# Patient Record
Sex: Female | Born: 1960 | Race: White | Hispanic: No
Health system: Southern US, Community
[De-identification: ages and names within clinical notes are randomized; demographics above are authoritative.]

## PROBLEM LIST (undated history)

## (undated) DIAGNOSIS — N186 End stage renal disease: Secondary | ICD-10-CM

## (undated) DIAGNOSIS — N289 Disorder of kidney and ureter, unspecified: Secondary | ICD-10-CM

## (undated) DIAGNOSIS — E785 Hyperlipidemia, unspecified: Secondary | ICD-10-CM

## (undated) DIAGNOSIS — I10 Essential (primary) hypertension: Secondary | ICD-10-CM

## (undated) DIAGNOSIS — E119 Type 2 diabetes mellitus without complications: Secondary | ICD-10-CM

---

## 2021-12-05 ENCOUNTER — Emergency Department (HOSPITAL_COMMUNITY): Payer: Medicare Other

## 2021-12-05 ENCOUNTER — Inpatient Hospital Stay (HOSPITAL_COMMUNITY)
Admission: EM | Admit: 2021-12-05 | Discharge: 2021-12-08 | DRG: 391 | Disposition: A | Discharge status: Home / Self Care | Payer: Medicare Other | Attending: Internal Medicine | Admitting: Internal Medicine

## 2021-12-05 ENCOUNTER — Other Ambulatory Visit: Payer: Self-pay

## 2021-12-05 ENCOUNTER — Encounter (HOSPITAL_COMMUNITY): Payer: Self-pay | Admitting: Emergency Medicine

## 2021-12-05 DIAGNOSIS — E039 Hypothyroidism, unspecified: Secondary | ICD-10-CM

## 2021-12-05 DIAGNOSIS — E785 Hyperlipidemia, unspecified: Secondary | ICD-10-CM

## 2021-12-05 DIAGNOSIS — K219 Gastro-esophageal reflux disease without esophagitis: Secondary | ICD-10-CM | POA: Diagnosis not present

## 2021-12-05 DIAGNOSIS — Z6829 Body mass index (BMI) 29.0-29.9, adult: Secondary | ICD-10-CM

## 2021-12-05 DIAGNOSIS — E1122 Type 2 diabetes mellitus with diabetic chronic kidney disease: Secondary | ICD-10-CM | POA: Diagnosis present

## 2021-12-05 DIAGNOSIS — I120 Hypertensive chronic kidney disease with stage 5 chronic kidney disease or end stage renal disease: Secondary | ICD-10-CM | POA: Diagnosis present

## 2021-12-05 DIAGNOSIS — D631 Anemia in chronic kidney disease: Secondary | ICD-10-CM | POA: Diagnosis present

## 2021-12-05 DIAGNOSIS — Z8673 Personal history of transient ischemic attack (TIA), and cerebral infarction without residual deficits: Secondary | ICD-10-CM

## 2021-12-05 DIAGNOSIS — D649 Anemia, unspecified: Secondary | ICD-10-CM

## 2021-12-05 DIAGNOSIS — I3139 Other pericardial effusion (noninflammatory): Secondary | ICD-10-CM | POA: Diagnosis present

## 2021-12-05 DIAGNOSIS — Z20822 Contact with and (suspected) exposure to covid-19: Secondary | ICD-10-CM | POA: Diagnosis present

## 2021-12-05 DIAGNOSIS — I160 Hypertensive urgency: Secondary | ICD-10-CM | POA: Diagnosis present

## 2021-12-05 DIAGNOSIS — F32A Depression, unspecified: Secondary | ICD-10-CM

## 2021-12-05 DIAGNOSIS — Z978 Presence of other specified devices: Secondary | ICD-10-CM

## 2021-12-05 DIAGNOSIS — R079 Chest pain, unspecified: Secondary | ICD-10-CM | POA: Diagnosis not present

## 2021-12-05 DIAGNOSIS — Z888 Allergy status to other drugs, medicaments and biological substances status: Secondary | ICD-10-CM

## 2021-12-05 DIAGNOSIS — I248 Other forms of acute ischemic heart disease: Secondary | ICD-10-CM | POA: Diagnosis present

## 2021-12-05 DIAGNOSIS — I252 Old myocardial infarction: Secondary | ICD-10-CM

## 2021-12-05 DIAGNOSIS — I2510 Atherosclerotic heart disease of native coronary artery without angina pectoris: Secondary | ICD-10-CM | POA: Diagnosis present

## 2021-12-05 DIAGNOSIS — H5461 Unqualified visual loss, right eye, normal vision left eye: Secondary | ICD-10-CM

## 2021-12-05 DIAGNOSIS — E119 Type 2 diabetes mellitus without complications: Secondary | ICD-10-CM

## 2021-12-05 DIAGNOSIS — F0390 Unspecified dementia without behavioral disturbance: Secondary | ICD-10-CM | POA: Diagnosis present

## 2021-12-05 DIAGNOSIS — E669 Obesity, unspecified: Secondary | ICD-10-CM | POA: Diagnosis present

## 2021-12-05 DIAGNOSIS — N186 End stage renal disease: Secondary | ICD-10-CM

## 2021-12-05 DIAGNOSIS — Z993 Dependence on wheelchair: Secondary | ICD-10-CM

## 2021-12-05 DIAGNOSIS — E1169 Type 2 diabetes mellitus with other specified complication: Secondary | ICD-10-CM

## 2021-12-05 DIAGNOSIS — Z992 Dependence on renal dialysis: Secondary | ICD-10-CM

## 2021-12-05 HISTORY — DX: Disorder of kidney and ureter, unspecified: N28.9

## 2021-12-05 HISTORY — DX: End stage renal disease: N18.6

## 2021-12-05 HISTORY — DX: Essential (primary) hypertension: I10

## 2021-12-05 HISTORY — DX: Hyperlipidemia, unspecified: E78.5

## 2021-12-05 HISTORY — DX: Type 2 diabetes mellitus without complications: E11.9

## 2021-12-05 LAB — CBC WITH DIFFERENTIAL/PLATELET
Abs Immature Granulocytes: 0.01 10*3/uL (ref 0.00–0.07)
Basophils Absolute: 0 10*3/uL (ref 0.0–0.1)
Basophils Relative: 1 %
Eosinophils Absolute: 0.2 10*3/uL (ref 0.0–0.5)
Eosinophils Relative: 4 %
HCT: 32.8 % — ABNORMAL LOW (ref 36.0–46.0)
Hemoglobin: 10.4 g/dL — ABNORMAL LOW (ref 12.0–15.0)
Immature Granulocytes: 0 %
Lymphocytes Relative: 19 %
Lymphs Abs: 0.9 10*3/uL (ref 0.7–4.0)
MCH: 31 pg (ref 26.0–34.0)
MCHC: 31.7 g/dL (ref 30.0–36.0)
MCV: 97.9 fL (ref 80.0–100.0)
Monocytes Absolute: 0.5 10*3/uL (ref 0.1–1.0)
Monocytes Relative: 11 %
Neutro Abs: 3.2 10*3/uL (ref 1.7–7.7)
Neutrophils Relative %: 65 %
Platelets: 232 10*3/uL (ref 150–400)
RBC: 3.35 MIL/uL — ABNORMAL LOW (ref 3.87–5.11)
RDW: 14.9 % (ref 11.5–15.5)
WBC: 4.9 10*3/uL (ref 4.0–10.5)
nRBC: 0 % (ref 0.0–0.2)

## 2021-12-05 LAB — MAGNESIUM: Magnesium: 2.2 mg/dL (ref 1.7–2.4)

## 2021-12-05 LAB — BASIC METABOLIC PANEL
Anion gap: 10 (ref 5–15)
BUN: 32 mg/dL — ABNORMAL HIGH (ref 6–20)
CO2: 26 mmol/L (ref 22–32)
Calcium: 9.2 mg/dL (ref 8.9–10.3)
Chloride: 105 mmol/L (ref 98–111)
Creatinine, Ser: 3.91 mg/dL — ABNORMAL HIGH (ref 0.44–1.00)
GFR, Estimated: 13 mL/min — ABNORMAL LOW (ref >60)
Glucose, Bld: 138 mg/dL — ABNORMAL HIGH (ref 70–99)
Potassium: 3.7 mmol/L (ref 3.5–5.1)
Sodium: 141 mmol/L (ref 135–145)

## 2021-12-05 LAB — TROPONIN I (HIGH SENSITIVITY)
Troponin I (High Sensitivity): 33 ng/L — ABNORMAL HIGH (ref <18)
Troponin I (High Sensitivity): 34 ng/L — ABNORMAL HIGH (ref <18)

## 2021-12-05 IMAGING — CR DG CHEST 2V
2 series · 2 of 2 positions shown · non-contrast
Comparison: None.

CLINICAL DATA: Chest pain, missed dialysis.

EXAM:
CHEST - 2 VIEW

[chest lat]
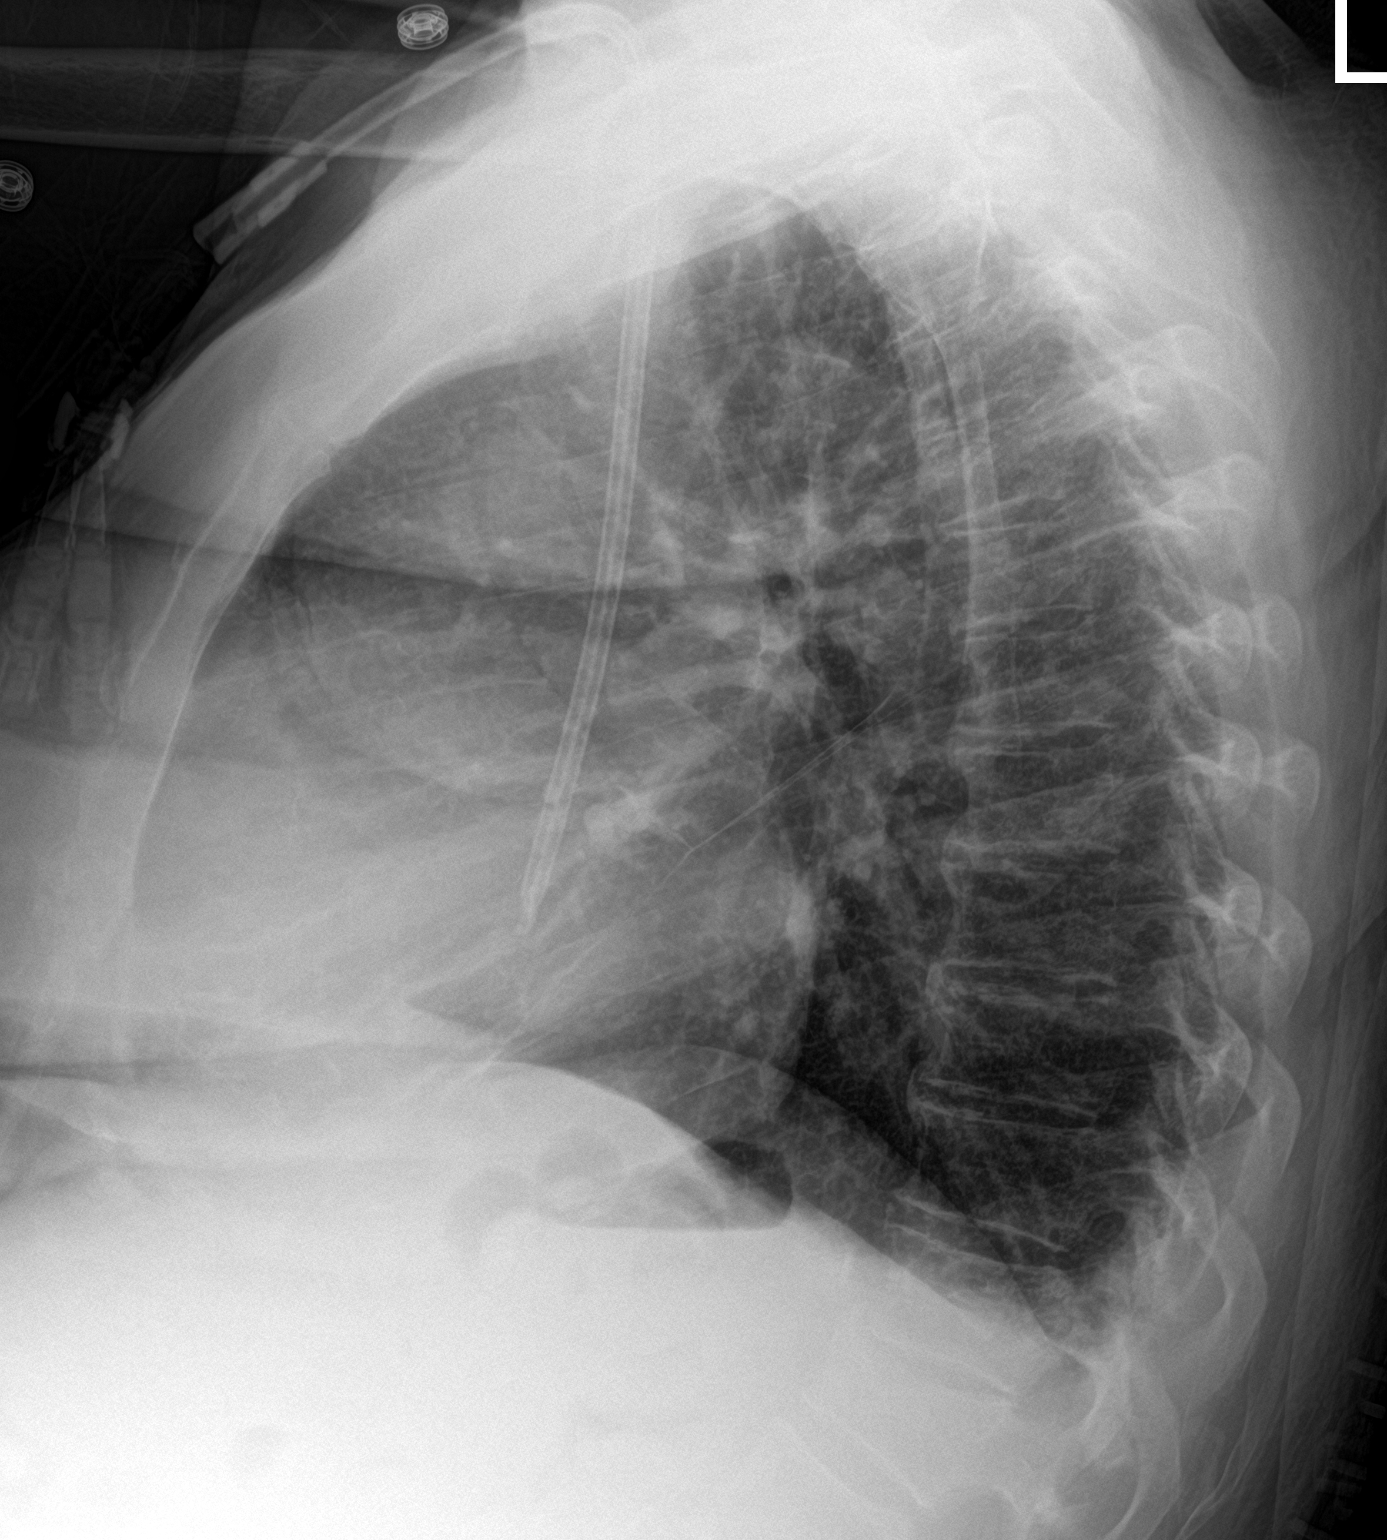

[chest ap]
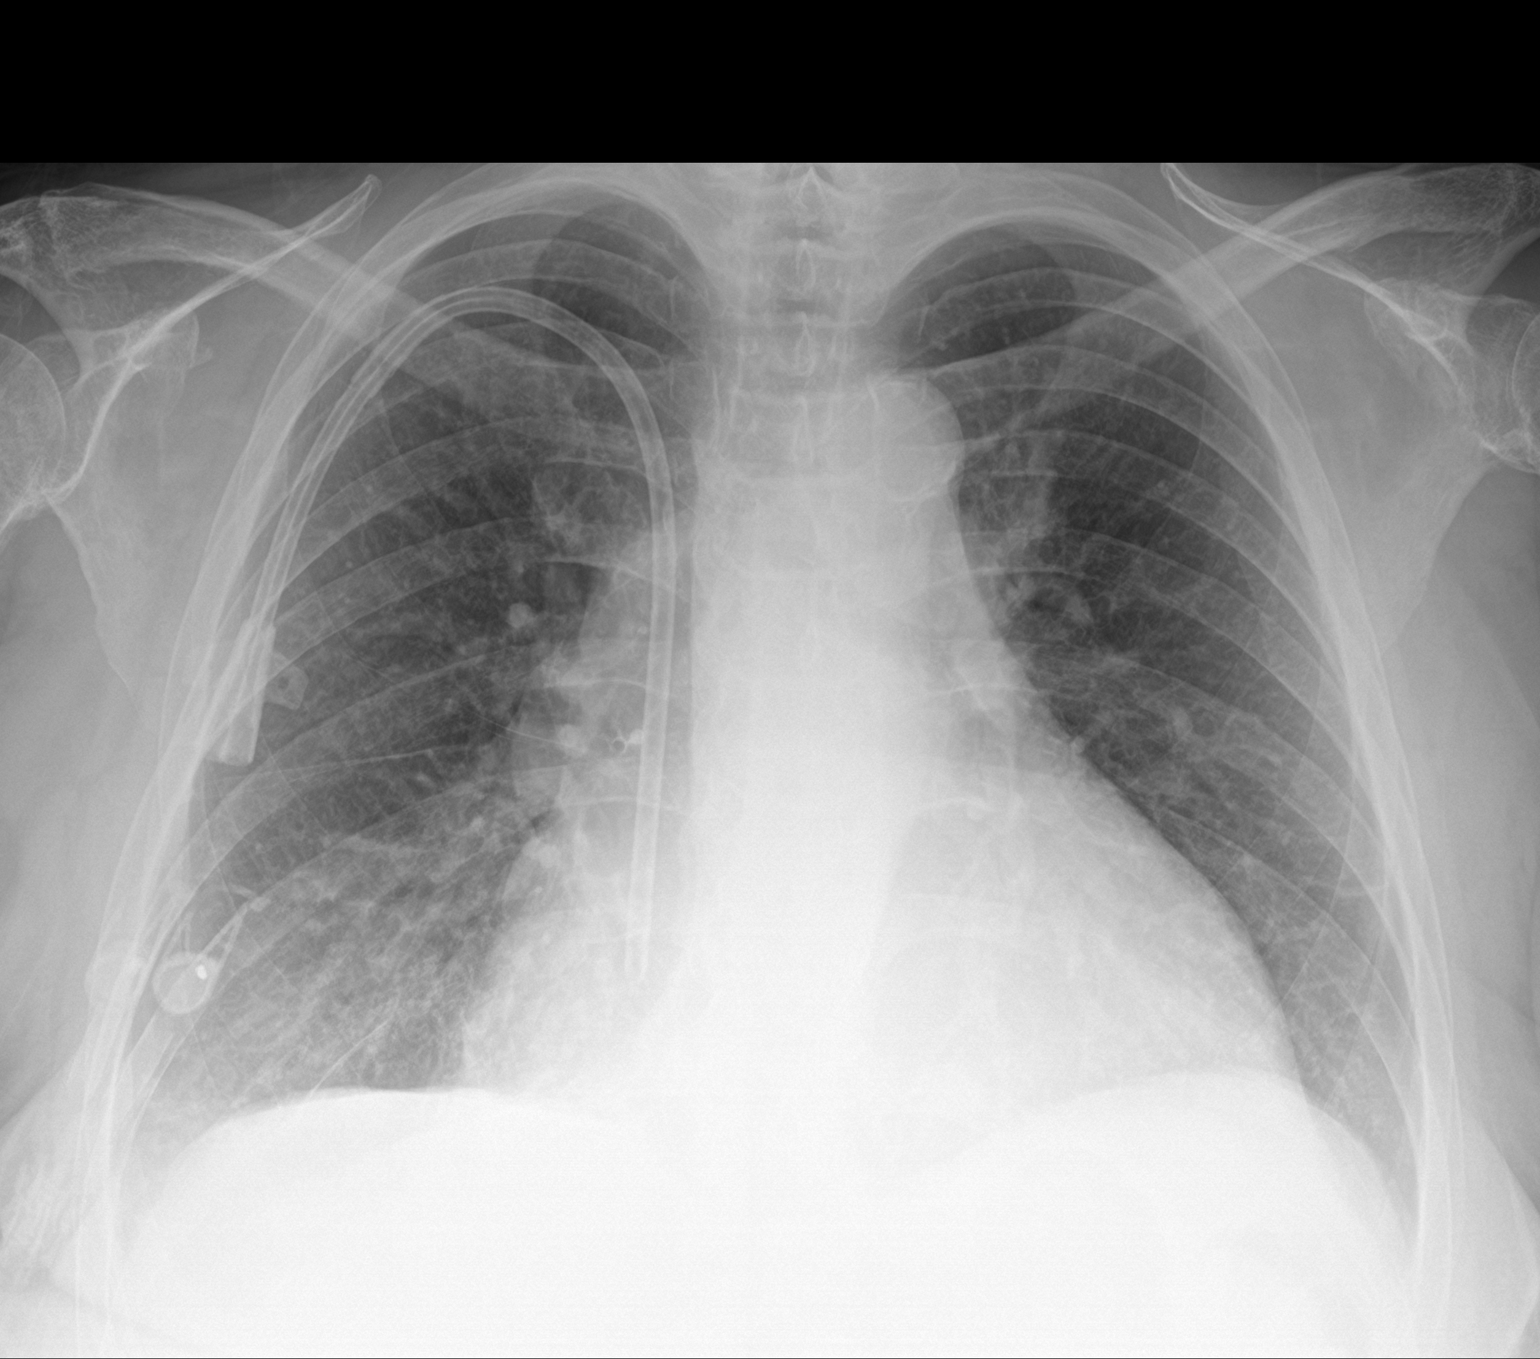

[2 of 2 positions shown; findings below may reference images not displayed]

FINDINGS: Right-sided central venous catheter tip projects over the distal
SVC. The heart is enlarged. There is no pleural effusion or
pneumothorax. No focal lung infiltrate or pulmonary edema. No acute
fractures.
IMPRESSION: 1. Cardiomegaly.
2. No acute cardiopulmonary process.

## 2021-12-05 MED ORDER — ALUM & MAG HYDROXIDE-SIMETH 200-200-20 MG/5ML PO SUSP
30.0000 mL | Freq: Once | ORAL | Status: AC
Start: 2021-12-05 — End: 2021-12-05
  Administered 2021-12-05: 30 mL via ORAL
  Filled 2021-12-05: qty 30

## 2021-12-05 MED ORDER — OXYCODONE-ACETAMINOPHEN 5-325 MG PO TABS
1.0000 | ORAL_TABLET | Freq: Once | ORAL | Status: AC
Start: 2021-12-05 — End: 2021-12-05
  Administered 2021-12-05: 1 via ORAL
  Filled 2021-12-05: qty 1

## 2021-12-05 MED ORDER — LIDOCAINE VISCOUS HCL 2 % MT SOLN
15.0000 mL | Freq: Once | OROMUCOSAL | Status: AC
  Administered 2021-12-05: 15 mL via ORAL
  Filled 2021-12-05: qty 15

## 2021-12-05 NOTE — H&P (Signed)
History and Physical    Patient: Susan Owen QBH:[id number] DOB: October 25, 1961 DOA: 12/05/2021 DOS: the patient was seen and examined on 12/06/2021 PCP: Pcp, No  Patient coming from: Home  Chief Complaint:  Chief Complaint  Patient presents with   missed dialysis   Chest Pain    HPI: [name] is a 61 y.o. female with medical history significant of ESRD on HD TTS, chronic indwelling Foley, hypertension, hyperlipidemia, hypothyroidism, CAD, pericardial effusion status post pericardiocentesis 09/03/2021, depression, cerebral amyloid angiopathy, type 2 diabetes, CHF, CVA, cognitive impairment, wheelchair-bound presented to the ED for evaluation of chest pain.  She missed dialysis yesterday.  In the ED, blood pressure elevated with systolic up to 097D.  Remainder of vital signs stable.  Labs showing WBC 4.9, hemoglobin 10.4, MCV 97.9, platelet count 232k.  Sodium 141, potassium 3.7, chloride 105, bicarb 26, BUN 32, creatinine 3.9, glucose 138.  COVID and influenza PCR pending.  Chest x-ray showing cardiomegaly and no acute cardiopulmonary process. EKG showing T wave abnormality in inferior and lateral leads, mild ST elevation in anterior leads (no prior tracing for comparison).  Cardiology (Dr. Martinique) consulted and did not feel that EKG met STEMI criteria.  Dr. Alfred Levins from cardiology also consulted. High sensitivity troponin 33 > 34.  Patient was given GI cocktail and Percocet.  Patient states she missed dialysis yesterday.  She started having chest pain this morning at rest.  States this started out as right-sided chest pain which then radiated to the left side of her chest.  No associated nausea, diaphoresis, or dyspnea.  Denies episodes of chest pain in the past.  Chest pain has now subsided after she received GI cocktail and Percocet in the ED.  She has not taken any of her home medications today.  No additional history could be obtained from her.  Review of Systems: As mentioned in the history of  present illness. All other systems reviewed and are negative.  Past medical history: See HPI.  Social History: No alcohol, tobacco, or illicit drug use.  Family history: No pertinent family history.  Prior to Admission medications   Not on File    Physical Exam: Vitals:   12/05/21 2100 12/05/21 2115 12/05/21 2145 12/05/21 2230  BP: (!) 166/71 (!) 165/72 (!) 174/76 (!) 169/75  Pulse: 69 69 67 74  Resp: 13 (!) $Remo'8 15 14  'nHdms$ Temp:      TempSrc:      SpO2: 97% 98% 95% 97%   Physical Exam Constitutional:      General: She is not in acute distress. HENT:     Head: Normocephalic and atraumatic.  Eyes:     Extraocular Movements: Extraocular movements intact.     Conjunctiva/sclera: Conjunctivae normal.  Cardiovascular:     Rate and Rhythm: Normal rate and regular rhythm.     Pulses: Normal pulses.  Pulmonary:     Effort: Pulmonary effort is normal. No respiratory distress.     Breath sounds: Normal breath sounds. No wheezing or rales.  Abdominal:     General: Bowel sounds are normal. There is no distension.     Palpations: Abdomen is soft.     Tenderness: There is no abdominal tenderness.  Musculoskeletal:     Cervical back: Normal range of motion and neck supple.     Comments: Mild pedal edema  Skin:    General: Skin is warm and dry.  Neurological:     General: No focal deficit present.     Mental Status: She  is alert and oriented to person, place, and time.     Data Reviewed:  Labs, imaging, and EKG reviewed (see HPI).  Assessment and Plan: * Chest pain- (present on admission) EKG showing T wave abnormality in inferior and lateral leads, mild ST elevation in anterior leads (no prior tracing for comparison). High sensitivity troponin mildly elevated but stable (33 > 34).  Chest pain has subsided after she received GI cocktail and Percocet.  PE less likely given no tachycardia or hypoxia. -Cardiology consulted -Cardiac monitoring  Hypertensive urgency- (present on  admission) Blood pressure currently elevated with systolic above 267.  Patient missed dialysis on Tuesday and has not taken her home antihypertensive medications today. -IV hydralazine prn.  Resume home meds after pharmacy med rec is done.  Consult nephrology in the morning for dialysis.  ESRD on dialysis [facility]) Patient missed dialysis yesterday.  Blood pressure significantly elevated.  Potassium and bicarb normal.  She has mild pedal edema on exam but no pulmonary edema on imaging.  Not hypoxic. -Consult nephrology in the morning  Normocytic anemia Hemoglobin 10.4, baseline hemoglobin 8-10.  Likely related to end-stage renal disease.  No signs of active bleeding. -Continue to monitor  Type 2 diabetes mellitus (HCC) -Check A1c.  Sliding scale insulin very sensitive ACHS.  Pharmacy med rec pending.  CAD (coronary artery disease)- (present on admission) See assessment and plan under chest pain. -Pharmacy med rec pending  Hypothyroidism -Pharmacy med rec pending  Hyperlipidemia -Pharmacy med rec pending   Advance Care Planning:   Code Status: Full Code.  Discussed with the patient.  Consults: Cardiology  Family Communication: No family available at this time.  Severity of Illness: The appropriate patient status for this patient is OBSERVATION. Observation status is judged to be reasonable and necessary in order to provide the required intensity of service to ensure the patient's safety. The patient's presenting symptoms, physical exam findings, and initial radiographic and laboratory data in the context of their medical condition is felt to place them at decreased risk for further clinical deterioration. Furthermore, it is anticipated that the patient will be medically stable for discharge from the hospital within 2 midnights of admission.   Author: Shela Leff, MD 12/06/2021 12:39 AM  For on call review [email or web address].

## 2021-12-05 NOTE — ED Triage Notes (Signed)
Patient is coming from home. States she missed a dialysis appointment and was told to come here, also endorses chest pain. VSS.

## 2021-12-05 NOTE — Assessment & Plan Note (Addendum)
Missed HD on 2/1-nephrology following for HD care.  Due to transportation issues-it was difficult for patient to go to her dialysis center in [place]-nephrology navigator has switched dialysis to [place] closer to where she lives.

## 2021-12-05 NOTE — ED Provider Notes (Signed)
Discussed with Dr. Martinique, ECG with anterior elevation. Does not meet STEMI criteria, recommends continued medical work up.    Gareth Morgan, MD 12/05/21 1729

## 2021-12-05 NOTE — ED Provider Triage Note (Signed)
Emergency Medicine Provider Triage Evaluation Note  Susan Owen , a 61 y.o. female  was evaluated in triage.  Patient with history of end-stage renal disease.  She is a poor historian at baseline.  Family not present during triage.  Pt complains of chest pain.  She states that she missed dialysis this morning.  She states she thinks she may have overslept.  She states that she was told to come to the emergency department but is not exactly sure why.  She reports having some pain in her upper chest.  No shortness of breath or cough.  Review of Systems  Positive: Chest pain Negative: Leg swelling  Physical Exam  BP (!) 160/75    Pulse (!) 57    Temp (!) 97.5 F (36.4 C) (Oral)    Resp 16    SpO2 100%  Gen:   Awake, no distress   Resp:  Normal effort  MSK:   Moves extremities without difficulty  Other:  Lungs clear to auscultation bilaterally, Foley leg bag in place, no significant edema of lower extremities  Medical Decision Making  Medically screening exam initiated at 4:49 PM.  Appropriate orders placed.  Susan Owen was informed that the remainder of the evaluation will be completed by another provider, this initial triage assessment does not replace that evaluation, and the importance of remaining in the ED until their evaluation is complete.     Carlisle Cater, PA-C 12/05/21 1650

## 2021-12-05 NOTE — Assessment & Plan Note (Addendum)
Felt to be atypical-probably related to GERD-chest pain has resolved after starting PPI.  Echo with preserved EF.  Evaluated by cardiology-no further work-up planned.  Cardiology will arrange for outpatient follow-up.

## 2021-12-05 NOTE — ED Provider Notes (Signed)
[facility] EMERGENCY DEPARTMENT Provider Note   CSN: [id number] Arrival date & time: 12/05/21  1541     History  Chief Complaint  Patient presents with   missed dialysis   Chest Pain    Susan Owen is a 61 y.o. female.   Chest Pain  Patient past medical history significant for Tuesday Thursday Saturday dialysis due to CKD, anemia of chronic disease, urinary retention with Foley, stroke, history of MI seems that she did not receive stent or CABG for this, pericardial effusion, dementia, CHF  Chest pain since approximately 9 AM this morning she describes as achy right-sided occasionally sharp.  Patient is dialysis patient does Tuesday Thursday Saturday dialysis states that she thinks that her last dialysis session was Saturday.  She states she does not know why she missed her Tuesday dialysis session.  She consents to me discussing this with her daughter since she has some memory issues which she states are unchanged.   Describes the pain is nonradiating.  Nonexertional although she is wheelchair-bound.  Nonpleuritic.  Denies any shortness of breath nausea or vomiting.    Discussed with daughter who gave additional information.   Saturday full session. Was supposed to go Tuesday but she asked not to go. Dialysis center scheduled her for this morning but family missed dialysis appointment.     Home Medications Prior to Admission medications   Not on File      Allergies    Coumadin [warfarin sodium], Nasal spray, Promethazine, and Tolmetin    Review of Systems   Review of Systems  Cardiovascular:  Positive for chest pain.   Physical Exam Updated Vital Signs BP (!) 204/84    Pulse 73    Temp (!) 97.5 F (36.4 C) (Oral)    Resp (!) 0    SpO2 99%  Physical Exam Vitals and nursing note reviewed.  Constitutional:      General: She is not in acute distress.    Comments: Chronically unwell appearing 61 year old female in no acute distress.  HENT:      Head: Normocephalic and atraumatic.     Nose: Nose normal.     Mouth/Throat:     Mouth: Mucous membranes are moist.  Eyes:     General: No scleral icterus. Cardiovascular:     Rate and Rhythm: Normal rate and regular rhythm.     Pulses: Normal pulses.     Heart sounds: Normal heart sounds.  Pulmonary:     Effort: Pulmonary effort is normal. No respiratory distress.     Breath sounds: No wheezing.  Chest:     Chest wall: No tenderness.  Abdominal:     Palpations: Abdomen is soft.     Tenderness: There is no abdominal tenderness. There is no guarding or rebound.  Musculoskeletal:     Cervical back: Normal range of motion.     Right lower leg: No edema.     Left lower leg: No edema.     Comments: No lower extremity edema or calf tenderness  Skin:    General: Skin is warm and dry.     Capillary Refill: Capillary refill takes less than 2 seconds.  Neurological:     Mental Status: She is alert. Mental status is at baseline.  Psychiatric:        Mood and Affect: Mood normal.        Behavior: Behavior normal.    ED Results / Procedures / Treatments   Labs (all labs ordered are  listed, but only abnormal results are displayed) Labs Reviewed  BASIC METABOLIC PANEL - Abnormal; Notable for the following components:      Result Value   Glucose, Bld 138 (*)    BUN 32 (*)    Creatinine, Ser 3.91 (*)    GFR, Estimated 13 (*)    All other components within normal limits  CBC WITH DIFFERENTIAL/PLATELET - Abnormal; Notable for the following components:   RBC 3.35 (*)    Hemoglobin 10.4 (*)    HCT 32.8 (*)    All other components within normal limits  CBG MONITORING, ED - Abnormal; Notable for the following components:   Glucose-Capillary 102 (*)    All other components within normal limits  TROPONIN I (HIGH SENSITIVITY) - Abnormal; Notable for the following components:   Troponin I (High Sensitivity) 34 (*)    All other components within normal limits  TROPONIN I (HIGH SENSITIVITY)  - Abnormal; Notable for the following components:   Troponin I (High Sensitivity) 33 (*)    All other components within normal limits  RESP PANEL BY RT-PCR (FLU A&B, COVID) ARPGX2  MAGNESIUM  HEMOGLOBIN A1C  HIV ANTIBODY (ROUTINE TESTING W REFLEX)  RENAL FUNCTION PANEL  CBC    EKG EKG Interpretation  Date/Time:  Wednesday December 05 2021 20:11:13 EST Ventricular Rate:  68 PR Interval:  180 QRS Duration: 101 QT Interval:  447 QTC Calculation: 476 R Axis:   30 Text Interpretation: Sinus rhythm Probable left atrial enlargement RSR' in V1 or V2, right VCD or RVH Probable anteroseptal infarct, old Abnormal T, consider ischemia, diffuse leads Confirmed by Regan Lemming ([id number]) on 12/05/2021 10:23:53 PM  Radiology DG Chest 2 View  Result Date: 12/05/2021 CLINICAL DATA:  Chest pain, missed dialysis. EXAM: CHEST - 2 VIEW COMPARISON:  None. FINDINGS: Right-sided central venous catheter tip projects over the distal SVC. The heart is enlarged. There is no pleural effusion or pneumothorax. No focal lung infiltrate or pulmonary edema. No acute fractures. IMPRESSION: 1. Cardiomegaly. 2. No acute cardiopulmonary process. Electronically Signed   By: Ronney Asters M.D.   On: 12/05/2021 19:49    Procedures Procedures    Medications Ordered in ED Medications  insulin aspart (novoLOG) injection 0-6 Units (has no administration in time range)  insulin aspart (novoLOG) injection 0-5 Units (0 Units Subcutaneous Not Given 12/06/21 0029)  acetaminophen (TYLENOL) tablet 650 mg (has no administration in time range)    Or  acetaminophen (TYLENOL) suppository 650 mg (has no administration in time range)  hydrALAZINE (APRESOLINE) injection 5 mg (has no administration in time range)  oxyCODONE-acetaminophen (PERCOCET/ROXICET) 5-325 MG per tablet 1 tablet (1 tablet Oral Given 12/05/21 2021)  alum & mag hydroxide-simeth (MAALOX/MYLANTA) 200-200-20 MG/5ML suspension 30 mL (30 mLs Oral Given 12/05/21 2157)    And   lidocaine (XYLOCAINE) 2 % viscous mouth solution 15 mL (15 mLs Oral Given 12/05/21 2157)    ED Course/ Medical Decision Making/ A&P Clinical Course as of 12/06/21 0042  Wed Dec 05, 2021  1940 Chest pain since approximately 9 AM this morning she describes as achy right-sided occasionally sharp.  Patient is dialysis patient does Tuesday Thursday Saturday dialysis states that she thinks that her last dialysis session was Saturday.  She states she does not know why she missed her Tuesday dialysis session.  She consents to me discussing this with her daughter since she has some memory issues which she states are unchanged.   Describes the pain is nonradiating.  Nonexertional although she  is wheelchair-bound.  Nonpleuritic.  Denies any shortness of breath nausea or vomiting. [WF]  2247 Discussed w cardiology Dr. Alfred Levins [WF]  2249 Stress test tomorrow. [WF]    Clinical Course User Index [WF] Tedd Sias, PA                           Medical Decision Making Amount and/or Complexity of Data Reviewed Labs: ordered. Radiology: ordered.  Risk OTC drugs. Prescription drug management. Decision regarding hospitalization.   This patient presents to the ED for concern of chest pain, this involves a number of treatment options, and is a complaint that carries with it a high risk of complications and morbidity.  The differential diagnosis includes ACS, PE, tamponade, thoracic aortic dissection, myocarditis, peritonitis, PTX, costochondritis, reflux, other   Co morbidities: Discussed in HPI   Brief History:  Patient with dementia relatively poor historian.  Seems that she has had persistent chest pain since 9 AM said that she has episodes of chest pain relatively frequently and seem to resolve with Tums however this episode did not.  She denies any associated nausea or diaphoresis.  Not able to tell me if it is exertional given that she has had partial foot amputations and is generally weak and  does not ambulate and primarily uses wheelchair.  Very low suspicion for PE, dissection  EMR reviewed including pt PMHx, past surgical history and past visits to ER.   See HPI for more details   Lab Tests:  I ordered and independently interpreted labs.  The pertinent results include:    I personally reviewed all laboratory work and imaging. Metabolic panel without any acute abnormality specifically kidney function within normal limits and no significant electrolyte abnormalities. CBC without leukocytosis or significant anemia.  Anemia with hemoglobin of 10.4.  BMP with elevated creatinine and BUN consistent with dialysis patient.  Troponin x2 flat 33/34   Imaging Studies:  Abnormal findings. I personally reviewed all imaging studies. Imaging notable for Cardiomegaly otherwise unremarkable   Cardiac Monitoring:  The patient was maintained on a cardiac monitor.  I personally viewed and interpreted the cardiac monitored which showed an underlying rhythm of: Sinus  EKG does show some T wave inversions.  No evidence of acute ischemia however.  This was reviewed by cardiology Dr. Alfred Levins   Medicines ordered:  I ordered medication including GI cocktail and Percocet for pain Reevaluation of the patient after these medicines showed that the patient improved I have reviewed the patients home medicines and have made adjustments as needed   Critical Interventions:     Consults:  I requested consultation with Dr. Jacky Kindle of cardiology,  and discussed lab and imaging findings as well as pertinent plan - they recommend: Admission to medicine with stress test for tomorrow.  Will see patient in consultation    Reevaluation:  After the interventions noted above I re-evaluated patient and found that they have :improved   Social Determinants of Health:  The patient's social determinants of health were a factor in the care of this patient    Problem List / ED Course:  Chest pain  Given  the patient has not had a past ischemic work-up discussed with cardiology recommend admission for stress test tomorrow.  I discussed with patient's daughter who is agreeable with my plan to admit.  Discussed my attending physician who agrees my plan.  Dispostion:  After consideration of the diagnostic results and the patients response to treatment,  I feel that the patent would benefit from admission.  Dr. Marlowe Sax will see patient for admission.  I appreciate her expert consultation and care patient.    Final Clinical Impression(s) / ED Diagnoses Final diagnoses:  Chest pain, unspecified type    Rx / DC Orders ED Discharge Orders     None         Tedd Sias, [place] 12/06/21 8599    Regan Lemming, MD 12/06/21 0104

## 2021-12-05 NOTE — Assessment & Plan Note (Addendum)
BP on the higher side-fluctuating-increase Coreg to 25 mg twice daily, continue amlodipine.  Follow and optimize.

## 2021-12-05 NOTE — Assessment & Plan Note (Addendum)
Likely due to CKD-defer Aranesp/iron therapy to nephrology.

## 2021-12-06 ENCOUNTER — Observation Stay (HOSPITAL_COMMUNITY): Payer: Medicare Other

## 2021-12-06 DIAGNOSIS — R079 Chest pain, unspecified: Secondary | ICD-10-CM | POA: Diagnosis present

## 2021-12-06 DIAGNOSIS — I160 Hypertensive urgency: Secondary | ICD-10-CM | POA: Diagnosis present

## 2021-12-06 DIAGNOSIS — G309 Alzheimer's disease, unspecified: Secondary | ICD-10-CM | POA: Diagnosis not present

## 2021-12-06 DIAGNOSIS — E039 Hypothyroidism, unspecified: Secondary | ICD-10-CM

## 2021-12-06 DIAGNOSIS — I120 Hypertensive chronic kidney disease with stage 5 chronic kidney disease or end stage renal disease: Secondary | ICD-10-CM | POA: Diagnosis present

## 2021-12-06 DIAGNOSIS — H5461 Unqualified visual loss, right eye, normal vision left eye: Secondary | ICD-10-CM | POA: Diagnosis present

## 2021-12-06 DIAGNOSIS — I252 Old myocardial infarction: Secondary | ICD-10-CM | POA: Diagnosis not present

## 2021-12-06 DIAGNOSIS — Z992 Dependence on renal dialysis: Secondary | ICD-10-CM | POA: Diagnosis not present

## 2021-12-06 DIAGNOSIS — E669 Obesity, unspecified: Secondary | ICD-10-CM | POA: Diagnosis present

## 2021-12-06 DIAGNOSIS — E7800 Pure hypercholesterolemia, unspecified: Secondary | ICD-10-CM | POA: Diagnosis not present

## 2021-12-06 DIAGNOSIS — Z8673 Personal history of transient ischemic attack (TIA), and cerebral infarction without residual deficits: Secondary | ICD-10-CM | POA: Diagnosis not present

## 2021-12-06 DIAGNOSIS — D631 Anemia in chronic kidney disease: Secondary | ICD-10-CM | POA: Diagnosis present

## 2021-12-06 DIAGNOSIS — Z978 Presence of other specified devices: Secondary | ICD-10-CM

## 2021-12-06 DIAGNOSIS — Z888 Allergy status to other drugs, medicaments and biological substances status: Secondary | ICD-10-CM | POA: Diagnosis not present

## 2021-12-06 DIAGNOSIS — Z20822 Contact with and (suspected) exposure to covid-19: Secondary | ICD-10-CM | POA: Diagnosis present

## 2021-12-06 DIAGNOSIS — H539 Unspecified visual disturbance: Secondary | ICD-10-CM | POA: Diagnosis not present

## 2021-12-06 DIAGNOSIS — I248 Other forms of acute ischemic heart disease: Secondary | ICD-10-CM | POA: Diagnosis present

## 2021-12-06 DIAGNOSIS — Z6829 Body mass index (BMI) 29.0-29.9, adult: Secondary | ICD-10-CM | POA: Diagnosis not present

## 2021-12-06 DIAGNOSIS — K219 Gastro-esophageal reflux disease without esophagitis: Secondary | ICD-10-CM | POA: Diagnosis present

## 2021-12-06 DIAGNOSIS — F0390 Unspecified dementia without behavioral disturbance: Secondary | ICD-10-CM | POA: Diagnosis present

## 2021-12-06 DIAGNOSIS — E1122 Type 2 diabetes mellitus with diabetic chronic kidney disease: Secondary | ICD-10-CM | POA: Diagnosis present

## 2021-12-06 DIAGNOSIS — I3139 Other pericardial effusion (noninflammatory): Secondary | ICD-10-CM | POA: Diagnosis present

## 2021-12-06 DIAGNOSIS — E785 Hyperlipidemia, unspecified: Secondary | ICD-10-CM | POA: Diagnosis present

## 2021-12-06 DIAGNOSIS — I2510 Atherosclerotic heart disease of native coronary artery without angina pectoris: Secondary | ICD-10-CM | POA: Diagnosis present

## 2021-12-06 DIAGNOSIS — N186 End stage renal disease: Secondary | ICD-10-CM | POA: Diagnosis present

## 2021-12-06 DIAGNOSIS — F0280 Dementia in other diseases classified elsewhere without behavioral disturbance: Secondary | ICD-10-CM

## 2021-12-06 DIAGNOSIS — Z993 Dependence on wheelchair: Secondary | ICD-10-CM | POA: Diagnosis not present

## 2021-12-06 LAB — ECHOCARDIOGRAM COMPLETE
Area-P 1/2: 4.29 cm2
Calc EF: 55.7 %
Height: 64 in
S' Lateral: 3.3 cm
Single Plane A2C EF: 53.3 %
Single Plane A4C EF: 57.3 %
Weight: 2779.2 oz

## 2021-12-06 LAB — CBC
HCT: 29.4 % — ABNORMAL LOW (ref 36.0–46.0)
Hemoglobin: 9.3 g/dL — ABNORMAL LOW (ref 12.0–15.0)
MCH: 31 pg (ref 26.0–34.0)
MCHC: 31.6 g/dL (ref 30.0–36.0)
MCV: 98 fL (ref 80.0–100.0)
Platelets: 202 10*3/uL (ref 150–400)
RBC: 3 MIL/uL — ABNORMAL LOW (ref 3.87–5.11)
RDW: 14.9 % (ref 11.5–15.5)
WBC: 5.1 10*3/uL (ref 4.0–10.5)
nRBC: 0 % (ref 0.0–0.2)

## 2021-12-06 LAB — GLUCOSE, CAPILLARY
Glucose-Capillary: 126 mg/dL — ABNORMAL HIGH (ref 70–99)
Glucose-Capillary: 126 mg/dL — ABNORMAL HIGH (ref 70–99)
Glucose-Capillary: 162 mg/dL — ABNORMAL HIGH (ref 70–99)
Glucose-Capillary: 128 mg/dL — ABNORMAL HIGH (ref 70–99)

## 2021-12-06 LAB — RESP PANEL BY RT-PCR (FLU A&B, COVID) ARPGX2
Influenza A by PCR: NEGATIVE
Influenza B by PCR: NEGATIVE
SARS Coronavirus 2 by RT PCR: NEGATIVE

## 2021-12-06 LAB — HIV ANTIBODY (ROUTINE TESTING W REFLEX): HIV Screen 4th Generation wRfx: NONREACTIVE

## 2021-12-06 LAB — RENAL FUNCTION PANEL
Albumin: 2.6 g/dL — ABNORMAL LOW (ref 3.5–5.0)
Anion gap: 10 (ref 5–15)
BUN: 40 mg/dL — ABNORMAL HIGH (ref 6–20)
CO2: 25 mmol/L (ref 22–32)
Calcium: 8.8 mg/dL — ABNORMAL LOW (ref 8.9–10.3)
Chloride: 105 mmol/L (ref 98–111)
Creatinine, Ser: 3.91 mg/dL — ABNORMAL HIGH (ref 0.44–1.00)
GFR, Estimated: 13 mL/min — ABNORMAL LOW (ref >60)
Glucose, Bld: 158 mg/dL — ABNORMAL HIGH (ref 70–99)
Phosphorus: 3.8 mg/dL (ref 2.5–4.6)
Potassium: 3.6 mmol/L (ref 3.5–5.1)
Sodium: 140 mmol/L (ref 135–145)

## 2021-12-06 LAB — CBG MONITORING, ED: Glucose-Capillary: 102 mg/dL — ABNORMAL HIGH (ref 70–99)

## 2021-12-06 MED ORDER — PANTOPRAZOLE SODIUM 40 MG PO TBEC
40.0000 mg | DELAYED_RELEASE_TABLET | Freq: Every day | ORAL | Status: DC
  Administered 2021-12-06 – 2021-12-08 (×3): 40 mg via ORAL
  Filled 2021-12-06 (×4): qty 1

## 2021-12-06 MED ORDER — AMLODIPINE BESYLATE 10 MG PO TABS
10.0000 mg | ORAL_TABLET | Freq: Every day | ORAL | Status: DC
  Administered 2021-12-06 – 2021-12-08 (×3): 10 mg via ORAL
  Filled 2021-12-06 (×4): qty 1

## 2021-12-06 MED ORDER — FLUOXETINE HCL 20 MG PO CAPS
20.0000 mg | ORAL_CAPSULE | Freq: Every day | ORAL | Status: DC
  Administered 2021-12-06 – 2021-12-08 (×3): 20 mg via ORAL
  Filled 2021-12-06 (×3): qty 1

## 2021-12-06 MED ORDER — ACETAMINOPHEN 325 MG PO TABS
650.0000 mg | ORAL_TABLET | Freq: Four times a day (QID) | ORAL | Status: DC | PRN
  Administered 2021-12-07: 650 mg via ORAL
  Filled 2021-12-06: qty 2

## 2021-12-06 MED ORDER — PENTAFLUOROPROP-TETRAFLUOROETH EX AERO: 1.0000 "application " | INHALATION_SPRAY | CUTANEOUS | Status: DC | PRN

## 2021-12-06 MED ORDER — INSULIN ASPART 100 UNIT/ML IJ SOLN: 0.0000 [IU] | Freq: Every day | INTRAMUSCULAR | Status: DC

## 2021-12-06 MED ORDER — HYDRALAZINE HCL 20 MG/ML IJ SOLN
5.0000 mg | INTRAMUSCULAR | Status: DC | PRN
  Administered 2021-12-06 – 2021-12-08 (×5): 5 mg via INTRAVENOUS
  Filled 2021-12-06 (×7): qty 1

## 2021-12-06 MED ORDER — HEPARIN SODIUM (PORCINE) 1000 UNIT/ML DIALYSIS: 1000.0000 [IU] | INTRAMUSCULAR | Status: DC | PRN

## 2021-12-06 MED ORDER — ALUM & MAG HYDROXIDE-SIMETH 200-200-20 MG/5ML PO SUSP
30.0000 mL | Freq: Four times a day (QID) | ORAL | Status: DC | PRN
  Administered 2021-12-06: 30 mL via ORAL
  Filled 2021-12-06: qty 30

## 2021-12-06 MED ORDER — SODIUM CHLORIDE 0.9 % IV SOLN: 100.0000 mL | INTRAVENOUS | Status: DC | PRN

## 2021-12-06 MED ORDER — ATORVASTATIN CALCIUM 40 MG PO TABS
40.0000 mg | ORAL_TABLET | Freq: Every day | ORAL | Status: DC
  Administered 2021-12-06 – 2021-12-08 (×3): 40 mg via ORAL
  Filled 2021-12-06 (×3): qty 1

## 2021-12-06 MED ORDER — LIDOCAINE HCL (PF) 1 % IJ SOLN: 5.0000 mL | INTRAMUSCULAR | Status: DC | PRN

## 2021-12-06 MED ORDER — LEVOTHYROXINE SODIUM 25 MCG PO TABS
25.0000 ug | ORAL_TABLET | Freq: Every day | ORAL | Status: DC
  Administered 2021-12-06 – 2021-12-08 (×3): 25 ug via ORAL
  Filled 2021-12-06 (×3): qty 1

## 2021-12-06 MED ORDER — HEPARIN SODIUM (PORCINE) 1000 UNIT/ML DIALYSIS
40.0000 [IU]/kg | INTRAMUSCULAR | Status: DC | PRN
  Filled 2021-12-06: qty 4

## 2021-12-06 MED ORDER — LABETALOL HCL 200 MG PO TABS
100.0000 mg | ORAL_TABLET | Freq: Two times a day (BID) | ORAL | Status: DC
  Administered 2021-12-06: 100 mg via ORAL
  Filled 2021-12-06: qty 1

## 2021-12-06 MED ORDER — HYDRALAZINE HCL 20 MG/ML IJ SOLN: 10.0000 mg | INTRAMUSCULAR | Status: DC | PRN

## 2021-12-06 MED ORDER — CARVEDILOL 12.5 MG PO TABS
12.5000 mg | ORAL_TABLET | Freq: Two times a day (BID) | ORAL | Status: DC
  Administered 2021-12-06 – 2021-12-07 (×3): 12.5 mg via ORAL
  Filled 2021-12-06 (×3): qty 1

## 2021-12-06 MED ORDER — ALTEPLASE 2 MG IJ SOLR: 2.0000 mg | Freq: Once | INTRAMUSCULAR | Status: DC | PRN

## 2021-12-06 MED ORDER — ACETAMINOPHEN 650 MG RE SUPP: 650.0000 mg | Freq: Four times a day (QID) | RECTAL | Status: DC | PRN

## 2021-12-06 MED ORDER — CHLORHEXIDINE GLUCONATE CLOTH 2 % EX PADS
6.0000 | MEDICATED_PAD | Freq: Every day | CUTANEOUS | Status: DC
  Administered 2021-12-06 – 2021-12-08 (×3): 6 via TOPICAL

## 2021-12-06 MED ORDER — INSULIN ASPART 100 UNIT/ML IJ SOLN
0.0000 [IU] | Freq: Three times a day (TID) | INTRAMUSCULAR | Status: DC
  Administered 2021-12-06 – 2021-12-08 (×2): 1 [IU] via SUBCUTANEOUS

## 2021-12-06 MED ORDER — LIDOCAINE-PRILOCAINE 2.5-2.5 % EX CREA: 1.0000 "application " | TOPICAL_CREAM | CUTANEOUS | Status: DC | PRN

## 2021-12-06 NOTE — Consult Note (Addendum)
Cardiology Consultation:   Patient ID: Susan Owen MRN: [id number]; DOB: November 01, 1961  Admit date: 12/05/2021 Date of Consult: 12/06/2021  PCP:  Susan Owen, No   [facility] Providers Cardiologist:  None        Patient Profile:   Susan Owen is a 61 y.o. 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 Marlowe Owen.  History of Present Illness:   [name] reports left upper chest dull pain radiating to her right chest intermittently while at rest since yesterday morning. When I see her in the ED, she says deep inspiration and palpation make her "feel the pain", however, her chest pain completely resolved with GI cocktail in the ED. Currently, she is chest pain free, on room air, complains of hungry, no acute distress.  She says she missed her dialysis yesterday (Tuesday). Patient's primary cardiologist is Dr. Kimber Owen @ [facility].  On admission, her SBP is approximately 179mmHg, troponin 33->34, creatinine 3.91 (was 2.3 10/24/21). ECG demonstrated TWI inferolateral leads.  Past Medical History:  Diagnosis Date   Renal disorder     Home Medications:  Prior to Admission medications   Not on File    Inpatient Medications: Scheduled Meds:  Continuous Infusions:  PRN Meds:   Allergies:    Allergies  Allergen Reactions   Coumadin [Warfarin Sodium] Other (See Comments)    Avoid all oral anticoagulation medications related to cerebral amyloid angiopathy       Nasal Spray     Other reaction(s): Other Other reaction(s): Cutaneous reactions   Promethazine     Restless legs   Tolmetin     Social History:   Social History   Socioeconomic History   Marital status: Widowed    Spouse name: Not on file   Number of children: Not on file   Years  of education: Not on file   Highest education level: Not on file  Occupational History   Not on file  Tobacco Use   Smoking status: Not on file   Smokeless tobacco: Not on file  Substance and Sexual Activity   Alcohol use: Not on file   Drug use: Not on file   Sexual activity: Not on file  Other Topics Concern   Not on file  Social History Narrative   Not on file   Social Determinants of Health   Financial Resource Strain: Not on file  Food Insecurity: Not on file  Transportation Needs: Not on file  Physical Activity: Not on file  Stress: Not on file  Social Connections: Not on file  Intimate Partner Violence: Not on file    Family History:   No family history on file.   ROS:  Please see the history of present illness.   All other ROS reviewed and negative.     Physical Exam/Data:   Vitals:   12/05/21 2230 12/05/21 2330 12/06/21 0000 12/06/21 0015  BP: (!) 169/75 (!) 174/70  (!) 204/84  Pulse: 74 69 72 73  Resp: 14  16 (!) 0  Temp:      TempSrc:      SpO2: 97% 96% 98% 99%   No intake or output data in the 24 hours ending 12/06/21 0043 No flowsheet data found.   There is no height or weight on file to calculate BMI.  General:  Well nourished, well developed, in no acute distress HEENT: normal Neck: no JVD Vascular: No carotid bruits; Distal pulses 2+ bilaterally Cardiac:  HD cathter on the right chest wall with skin benign appearance, tenderness on palpation of upper chest wall bilaterally, normal S1, S2; RRR; no murmur  Lungs:  clear to auscultation bilaterally, no wheezing, rhonchi or rales  Abd: soft, nontender, no hepatomegaly  Ext: 1+ pitting edema bilaterally Musculoskeletal:  No deformities, BUE and BLE strength normal and equal Skin: warm and dry  Neuro:  CNs 2-12 intact, no focal abnormalities noted Psych:  Normal affect   Laboratory Data:  High Sensitivity Troponin:   Recent Labs  Lab 12/05/21 1541 12/05/21 1653  TROPONINIHS 33* 34*      Chemistry Recent Labs  Lab 12/05/21 1653  NA 141  K 3.7  CL 105  CO2 26  GLUCOSE 138*  BUN 32*  CREATININE 3.91*  CALCIUM 9.2  MG 2.2  GFRNONAA 13*  ANIONGAP 10    No results for input(s): PROT, ALBUMIN, AST, ALT, ALKPHOS, BILITOT in the last 168 hours. Lipids No results for input(s): CHOL, TRIG, HDL, LABVLDL, LDLCALC, CHOLHDL in the last 168 hours.  Hematology Recent Labs  Lab 12/05/21 1653  WBC 4.9  RBC 3.35*  HGB 10.4*  HCT 32.8*  MCV 97.9  MCH 31.0  MCHC 31.7  RDW 14.9  PLT 232   Thyroid No results for input(s): TSH, FREET4 in the last 168 hours.  BNPNo results for input(s): BNP, PROBNP in the last 168 hours.  DDimer No results for input(s): DDIMER in the last 168 hours.   Radiology/Studies:  DG Chest 2 View  Result Date: 12/05/2021 CLINICAL DATA:  Chest pain, missed dialysis. EXAM: CHEST - 2 VIEW COMPARISON:  None. FINDINGS: Right-sided central venous catheter tip projects over the distal SVC. The heart is enlarged. There is no pleural effusion or pneumothorax. No focal lung infiltrate or pulmonary edema. No acute fractures. IMPRESSION: 1. Cardiomegaly. 2. No acute cardiopulmonary process. Electronically Signed   By: Ronney Asters M.D.   On: 12/05/2021 19:49     Assessment and Plan:   #Chest pain -atypical, tenderness on palpation, worse with deep inspiration, resolved with GI cocktail -troponin slightly elevated and flat in the setting of hypertension and acute on CKD. EKGs demonstrated TWI inferolateral lead (appears to be old compared to her EKGs@[facility] ) -continue Telemetry -EKG prn -acquire an echocardiogram am.   Risk Assessment/Risk Scores:     HEAR Score (for undifferentiated chest pain):    HEAR Score: 4 For questions or updates, please contact [place] Please consult [email or web address] for contact info under    Signed, Laurice Record, MD  12/06/2021 12:43 AM

## 2021-12-06 NOTE — Consult Note (Signed)
Reason for Consult: Continuity of ESRD care Referring Physician: Royanne Foots MD [facility])  HPI:  61 year old woman with past history significant for hypertension, hypothyroidism, history of coronary artery disease, history of pericardial effusion status post pericardiocentesis in October 2022, cerebral amyloid angiopathy, type 2 diabetes mellitus, congestive heart failure, history of cognitive impairment, chronic indwelling Foley catheter and end-stage renal disease on hemodialysis (per patient MWF schedule).  She called her hemodialysis unit yesterday for concerns of right-sided chest pain at rest and was directed to the emergency room following which she was admitted for additional evaluation and management.  She today reports that the chest pain has essentially resolved.  She denies any shortness of breath or preceding fevers or chills.  She has poor knowledge/recollection of her dialysis treatments and directs questions to "call my daughter" and cannot tell me where she lives.  Hemodialysis prescription: Monday/Wednesday/Friday, [facility] dialysis unit ([place]), EDW 75.5 kg, 3 hours, BFR 350/DFR 600, 200 dialyzer, 2K/2.5 calcium, no UF profile, right IJ TDC, heparin bolus 3400 with maintenance 500 units an hour.  No additional medication information available/sent.  Hepatitis B surface antigen negative on 09/07/2021  Past Medical History:  Diagnosis Date   Renal disorder     Social History:  has no history on file for tobacco use, alcohol use, and drug use.  Allergies:  Allergies  Allergen Reactions   Coumadin [Warfarin Sodium] Other (See Comments)    Avoid all oral anticoagulation medications related to cerebral amyloid angiopathy       Nasal Spray     Other reaction(s): Other Other reaction(s): Cutaneous reactions   Promethazine     Restless legs   Tolmetin     Medications: I have reviewed the patient's current medications. Scheduled:   amLODipine  10 mg Oral Daily   atorvastatin  40 mg Oral Daily   Chlorhexidine Gluconate Cloth  6 each Topical Daily   insulin aspart  0-5 Units Subcutaneous QHS   insulin aspart  0-6 Units Subcutaneous TID WC   labetalol  100 mg Oral BID   pantoprazole  40 mg Oral Q1200    BMP Latest Ref Rng & Units 12/06/2021 12/05/2021  Glucose 70 - 99 mg/dL 158(H) 138(H)  BUN 6 - 20 mg/dL 40(H) 32(H)  Creatinine 0.44 - 1.00 mg/dL 3.91(H) 3.91(H)  Sodium 135 - 145 mmol/L 140 141  Potassium 3.5 - 5.1 mmol/L 3.6 3.7  Chloride 98 - 111 mmol/L 105 105  CO2 22 - 32 mmol/L 25 26  Calcium 8.9 - 10.3 mg/dL 8.8(L) 9.2   CBC Latest Ref Rng & Units 12/06/2021 12/05/2021  WBC 4.0 - 10.5 K/uL 5.1 4.9  Hemoglobin 12.0 - 15.0 g/dL 9.3(L) 10.4(L)  Hematocrit 36.0 - 46.0 % 29.4(L) 32.8(L)  Platelets 150 - 400 K/uL 202 232     DG Chest 2 View  Result Date: 12/05/2021 CLINICAL DATA:  Chest pain, missed dialysis. EXAM: CHEST - 2 VIEW COMPARISON:  None. FINDINGS: Right-sided central venous catheter tip projects over the distal SVC. The heart is enlarged. There is no pleural effusion or pneumothorax. No focal lung infiltrate or pulmonary edema. No acute fractures. IMPRESSION: 1. Cardiomegaly. 2. No acute cardiopulmonary process. Electronically Signed   By: Ronney Asters M.D.   On: 12/05/2021 19:49    Review of Systems  Constitutional:  Positive for fatigue. Negative for chills and fever.  HENT:  Negative for congestion, nosebleeds, sore throat and trouble swallowing.   Eyes:  Negative for pain and visual disturbance.  Respiratory:  Positive for chest tightness. Negative for shortness of breath.   Cardiovascular:  Positive for chest pain. Negative for leg swelling.  Gastrointestinal:  Negative for abdominal pain, constipation, diarrhea, nausea and vomiting.  Endocrine: Negative for cold intolerance.  Genitourinary:  Negative for dysuria, frequency, hematuria and urgency.  Musculoskeletal:  Negative for back pain and joint  swelling.  Skin:  Negative for rash and wound.  Neurological:  Negative for dizziness, tremors and light-headedness.  Blood pressure (!) 161/68, pulse 68, temperature 98.4 F (36.9 C), temperature source Oral, resp. rate 15, height 5\' 4"  (1.626 m), weight 78.8 kg, SpO2 96 %. Physical Exam Vitals and nursing note reviewed.  Constitutional:      Appearance: She is well-developed and normal weight. She is not ill-appearing.  HENT:     Head: Normocephalic and atraumatic.  Eyes:     Pupils: Pupils are equal, round, and reactive to light.  Neck:     Vascular: No JVD.  Cardiovascular:     Rate and Rhythm: Normal rate and regular rhythm.     Heart sounds: Normal heart sounds. No murmur heard. Pulmonary:     Effort: Pulmonary effort is normal. No tachypnea or respiratory distress.     Breath sounds: Normal breath sounds.     Comments: Right IJ TDC in place Abdominal:     General: Bowel sounds are normal.     Palpations: Abdomen is soft.  Musculoskeletal:     Cervical back: Normal range of motion and neck supple.     Right lower leg: Edema present.     Left lower leg: Edema present.     Comments: Trace-1+ bilateral lower extremity edema  Skin:    General: Skin is warm and dry.     Coloration: Skin is pale.  Neurological:     Mental Status: She is alert. She is disoriented.    Assessment/Plan: 1.  Chest pain: Atypical and possibly pleuritic with associated chest wall tenderness on palpation.  Earlier today had 2D echocardiogram to assess for recurrence of pericardial effusion/structural abnormalities.  Seen earlier by cardiology. 2.  End-stage renal disease: Per the patient (and unfortunately cannot be confirmed from records available from her dialysis unit), she informs me that she runs on a Monday/Wednesday/Friday dialysis schedule and missed dialysis yesterday.  Neither does her clinical exam nor labs indicate any acute indications for dialysis at this time and she will be scheduled  for this tomorrow. 3.  Hypertension: Blood pressures elevated, resumed back on amlodipine, hydralazine and labetalol and will monitor with hemodialysis. 4.  Anemia of chronic disease: Marginally depressed hemoglobin and hematocrit, resume ESA as an outpatient unless acute hemoglobin drop noted. 5.  CKD-MBD: No records provided as to whether she is on vitamin D receptor analog or Cinacalcet.  Continue renal diet while monitoring calcium and phosphorus level.  Garo Heidelberg K. 12/06/2021, 10:16 AM

## 2021-12-06 NOTE — Assessment & Plan Note (Addendum)
Continue statin. 

## 2021-12-06 NOTE — ED Notes (Signed)
Pt resting comfortably in bed w/ eyes shut, no acute distress.

## 2021-12-06 NOTE — Assessment & Plan Note (Addendum)
CBG stable-continue SSI.  A1c 5.3.  Likely managed with diet control in the outpatient setting.   Recent Labs    12/07/21 2110 12/08/21 0814 12/08/21 1135  GLUCAP 138* 92 159*

## 2021-12-06 NOTE — Progress Notes (Addendum)
Progress Note  Patient Name: Susan Owen Date of Encounter: 12/06/2021  [facility] Cardiologist: Osborne Oman  Subjective   Patient admitted overnight for chest pain as a dull ache to me. This occurred after a missed dialysis session. She told the overnight fellow that pain was worse with deep inspiration as well. Pain resolved with GI cocktail and Percocet in the ED. She does have a history of reflux but states this felt different. She is currently chest pain free. No shortness of breath. She is wondering when she can get out of here.  Inpatient Medications    Scheduled Meds:  amLODipine  10 mg Oral Daily   Chlorhexidine Gluconate Cloth  6 each Topical Daily   insulin aspart  0-5 Units Subcutaneous QHS   insulin aspart  0-6 Units Subcutaneous TID WC   labetalol  100 mg Oral BID   Continuous Infusions:  PRN Meds: acetaminophen **OR** acetaminophen, hydrALAZINE   Vital Signs    Vitals:   12/06/21 0548 12/06/21 0550 12/06/21 0620 12/06/21 0747  BP: (!) 202/83 (!) 190/86 (!) 158/63 (!) 162/61  Pulse:    73  Resp: 13 18 (!) 25 13  Temp:    98.4 F (36.9 C)  TempSrc:    Oral  SpO2:      Weight:      Height:        Intake/Output Summary (Last 24 hours) at 12/06/2021 0821 Last data filed at 12/06/2021 0445 Gross per 24 hour  Intake --  Output 450 ml  Net -450 ml   Last 3 Weights 12/06/2021  Weight (lbs) 173 lb 11.2 oz  Weight (kg) 78.79 kg      Telemetry    Normal sinus rhythm with rates in the 70s. - Personally Reviewed  ECG    No new ECG tracing today. - Personally Reviewed  Physical Exam   GEN: No acute distress.   Neck: No JVD. Cardiac: RRR. No murmurs, rubs, or gallops. Radial pulses 2+ and equal bilaterally. Respiratory: Clear to auscultation bilaterally. No wheezes, rhonchi, or rales. GI: Soft, non-distended, and non-tender.  MS: Trace lower extremity edema bilaterally. No deformity. Skin: Warm and dry. Neuro:  No focal deficits. Psych: Normal affect.    Labs    High Sensitivity Troponin:   Recent Labs  Lab 12/05/21 1541 12/05/21 1653  TROPONINIHS 33* 34*     Chemistry Recent Labs  Lab 12/05/21 1653 12/06/21 0417  NA 141 140  K 3.7 3.6  CL 105 105  CO2 26 25  GLUCOSE 138* 158*  BUN 32* 40*  CREATININE 3.91* 3.91*  CALCIUM 9.2 8.8*  MG 2.2  --   ALBUMIN  --  2.6*  GFRNONAA 13* 13*  ANIONGAP 10 10    Lipids No results for input(s): CHOL, TRIG, HDL, LABVLDL, LDLCALC, CHOLHDL in the last 168 hours.  Hematology Recent Labs  Lab 12/05/21 1653 12/06/21 0417  WBC 4.9 5.1  RBC 3.35* 3.00*  HGB 10.4* 9.3*  HCT 32.8* 29.4*  MCV 97.9 98.0  MCH 31.0 31.0  MCHC 31.7 31.6  RDW 14.9 14.9  PLT 232 202   Thyroid No results for input(s): TSH, FREET4 in the last 168 hours.  BNPNo results for input(s): BNP, PROBNP in the last 168 hours.  DDimer No results for input(s): DDIMER in the last 168 hours.   Radiology    DG Chest 2 View  Result Date: 12/05/2021 CLINICAL DATA:  Chest pain, missed dialysis. EXAM: CHEST - 2 VIEW COMPARISON:  None.  FINDINGS: Right-sided central venous catheter tip projects over the distal SVC. The heart is enlarged. There is no pleural effusion or pneumothorax. No focal lung infiltrate or pulmonary edema. No acute fractures. IMPRESSION: 1. Cardiomegaly. 2. No acute cardiopulmonary process. Electronically Signed   By: Ronney Asters M.D.   On: 12/05/2021 19:49    Cardiac Studies   Limited Echocardiogram 2021/09/27: Left Ventricle  Left ventricle size is normal. There is mild concentric hypertrophy. Systolic function is normal. EF: 55-60%. Wall motion is normal.   Right Ventricle  Right ventricle is normal. Systolic function is normal.   Left Atrium  Left atrium is normal in size.   Right Atrium  Normal sized right atrium.   Mitral Valve  Mitral valve structure is normal.   Tricuspid Valve  Tricuspid valve structure is normal.   Ascending Aorta  The aortic root is normal in size.    Pericardium  There is no pericardial effusion.   Summary: Left Ventricle: Systolic function is normal. EF: 55-60%.    Left Ventricle: Wall motion is normal.    Pericardium: There is no pericardial effusion.  Patient Profile     61 y.o. female who is wheelchair bound and has a history of pericardial effusion s/p pericardiocentesis in 08/2021, hypertension, hyperlipidemia, type 2 diabetes mellitus, ESRD on hemodialysis, hypothyroidism, chronic indwelling Foley, prior CVA, and cognitive impairment who was admitted on 12/05/2021 for chest pain after missing dialysis the day before.  Assessment & Plan    Chest Pain Patient presented with atypical chest pain that was worse with deep inspiration after missing a dialysis session the day before. Pain resolved with GI cocktail and Percocet. She was also noted to have  chest wall tenderness with palpation. EKG showed T wave inversions in inferolateral leads which do not appear to be new based on prior EKG report at [facility]. High-sensitivity troponin minimally elevated and flat at 33 >> 34. - Currently chest pain free. - Will check an Echo. - Presentation atypical as above. Troponin elevation consistent with demand ischemia in the setting of marked hypertension and ESRD. Will wait for Echo results - if LV normal and wall motion abnormalities, may not need any additional ischemic work-up.  Of note, CAD listed in H&P but patient denies ever being diagnosed with CAD. She is a poor historian and recently moved here from [place]. However, records from [place] unavailable. No family at bedside to confirm history. She has had a few admission at [facility] in the past few months and no mention of CAD in those notes.  Pericardial Effusion S/p pericardiocentesis in 08/2021 at [facility]. - Will check an Echo.  Hypertension BP as high as 204/84 on arrival after missing dialysis the day prior to admission and not taken any of her home antihypertensives that day. We  are waiting for Pharmacy to complete full med reconciliation but looks like she is on Amlodipine 10mg  daily, Hydralazine 25mg  three times daily, and Labetolol 100mg  twice daily at home. - Most recent BP 162/61. - Home Amlodipine and Labetalol have been restarted this morning. Could consider switching from Labetalol to Coreg.  Hyperlipidemia - Will restart home Lipitor 40mg  daily.  ESRD On hemodialysis T/Th/Sat. - Management per Nephrology.  Otherwise, per primary team: - Type 2 diabetes mellitus - Hypothyroidism - Chronic anemia - History of CVA - Chronic indwelling Foley - Cognitive impairment  For questions or updates, please contact [facility] Please consult [email or web address] for contact info under        Signed, [employer]  Gabriel Carina, PA-C  12/06/2021, 8:21 AM    Patient seen and examined and agree with Sande Rives, PA-C as detailed above.  In brief, the patient is a 60 y.o. female who is wheelchair bound and has a history of pericardial effusion s/p pericardiocentesis in 08/2021, hypertension, hyperlipidemia, type 2 diabetes mellitus, ESRD on hemodialysis, hypothyroidism, chronic indwelling Foley, prior CVA, and cognitive impairment who presented to the ER with chest pain after missing HD session found to have BP in 200s. Cardiology is consulted for chest pain.  Chest pain on admission atypical in nature and worse with inspiration. Was notably very hypertensive with SBP 200s and had missed an HD session which were likely the primary drivers of symptoms. Low concern for ACS. Trop flat in 30s. ECG with TWI inferolaterally which is chronic compared to prior. Limited TTE 09/2021 with normal EF 55-60%, normal RV, no significant valve disease, no pericardial effusion. Repeat TTE here on my review with EF 55-60%, G2DD, elevated filling pressures, mild LVH, normal RV size and systolic function, small pericardial effusion.   Currently chest pain free. Recommend aggressive blood pressure  control and HD for volume management. No ischemic work-up planned as in-patient unless clinical change.    GEN: No acute distress.   Neck: JVD to mid-neck at 20 degrees Cardiac: RRR, no murmurs, rubs, or gallops.  Respiratory: Clear to auscultation bilaterally. GI: Soft, nontender, non-distended  MS: Trace edema, warm Neuro:  Nonfocal  Psych: Normal Affect  Plan: -Suspect trop elevation secondary to demand in the setting of severe hypertension and missed HD session -TTE on my review EF 55-60%, G2DD, elevated filling pressures, mild LVH, normal RV size and systolic function, small pericardial effusion -No ischemic work-up planned as in-patient; can consider as an out-patient -Needs aggressive blood pressure control -Resumed amlodipine 10mg  daily -Change labetalol to coreg 12.5mg  BID and up-titrate as tolerated -Resume home hydralazine vs start imdur as needed for additional BP control  -Continue home lipitor 40mg  daily -Volume management with HD  Cardiology will sign-off. Will arrange for out-patient follow-up.   Gwyndolyn Kaufman, MD

## 2021-12-06 NOTE — Assessment & Plan Note (Signed)
See assessment and plan under chest pain. -Pharmacy med rec pending

## 2021-12-06 NOTE — Assessment & Plan Note (Signed)
-  Pharmacy med rec pending

## 2021-12-06 NOTE — Assessment & Plan Note (Addendum)
Resume Synthroid. 

## 2021-12-06 NOTE — Progress Notes (Signed)
PROGRESS NOTE        PATIENT DETAILS Name: Susan Owen Age: 61 y.o. Sex: female Date of Birth: 1961-08-03 Admit Date: 12/05/2021 Admitting Physician Shela Leff, MD PCP:Pcp, No  Brief Summary: Patient is a 61 y.o.  female with history of ESRD on HD, HTN, HLD-presented to the hospital for evaluation of chest pain.   Significant Hospital events: 2/1>> admit to [facility] for chest pain.  Significant imaging studies: 2/1>> CXR: No pneumonia  Significant microbiology data: 2/1>> COVID/flu PCR: Negative  Procedures: None  Consults:  Cardiology, nephrology  Subjective:   Very poor historian-no family at bedside.  Claims she just moved into [place] from [place]  Objective: Vitals: Blood pressure (!) 181/58, pulse 72, temperature 98.2 F (36.8 C), resp. rate 16, height 5\' 4"  (1.626 m), weight 78.8 kg, SpO2 99 %.   Exam: Gen Exam:Alert awake-not in any distress HEENT:atraumatic, normocephalic Chest: B/L clear to auscultation anteriorly CVS:S1S2 regular Abdomen:soft non tender, non distended Extremities:no edema Neurology: Non focal Skin: no rash  Pertinent Labs/Radiology: CBC Latest Ref Rng & Units 12/06/2021 12/05/2021  WBC 4.0 - 10.5 K/uL 5.1 4.9  Hemoglobin 12.0 - 15.0 g/dL 9.3(L) 10.4(L)  Hematocrit 36.0 - 46.0 % 29.4(L) 32.8(L)  Platelets 150 - 400 K/uL 202 232    Lab Results  Component Value Date   NA 140 12/06/2021   K 3.6 12/06/2021   CL 105 12/06/2021   CO2 25 12/06/2021      Assessment/Plan: * Chest pain- (present on admission) Felt to be atypical-awaiting echo-troponins/EKG nonacute.  Likely related to GERD-starting PPI.  Cardiology following.  Hypertensive urgency- (present on admission) BP fluctuating-have resumed labetalol and amlodipine.  Should improve with hemodialysis as well.  Follow and adjust.  ESRD on dialysis [facility]) Missed HD on 2/1-nephrology consulted for HD care.  Normocytic anemia Likely due to  CKD-defer Aranesp/iron therapy to nephrology.  Type 2 diabetes mellitus (HCC) CBG stable-continue SSI.  A1c pending.   Recent Labs    12/06/21 0027 12/06/21 0808  GLUCAP 102* 126*    Hyperlipidemia Continue statin.  Hypothyroidism Resume Synthroid after medication reconciliation has been completed.  Chronic indwelling Foley catheter Follows with urology in the outpatient setting-I have asked RN to change Foley catheter.  No signs of UTI.  Dementia ([place])- (present on admission) Per daughter who I spoke to over the phone on 2/2-patient has been diagnosed with early onset dementia.  Patient appears to be a terrible historian at baseline.   BMI: Estimated body mass index is 29.82 kg/m as calculated from the following:   Height as of this encounter: 5\' 4"  (1.626 m).   Weight as of this encounter: 78.8 kg.   Code status:   Code Status: Full Code   DVT Prophylaxis: SCDs Start: 12/06/21 0003    Procedures: None Consults: Cardiology, nephrology Family Communication: Daughter Kristen-[phone]-updated over the phone on 2/2   Disposition Plan: Status is: Observation The patient remains OBS appropriate and will d/c before 2 midnights.  Awaiting echo-and and dialysis.   Diet: Diet Order             Diet renal/carb modified with fluid restriction Diet-HS Snack? Nothing; Fluid restriction: 1200 mL Fluid; Room service appropriate? Yes; Fluid consistency: Thin  Diet effective now  Antimicrobial agents: Anti-infectives (From admission, onward)    None        MEDICATIONS: Scheduled Meds:  amLODipine  10 mg Oral Daily   atorvastatin  40 mg Oral Daily   carvedilol  12.5 mg Oral BID WC   Chlorhexidine Gluconate Cloth  6 each Topical Daily   insulin aspart  0-5 Units Subcutaneous QHS   insulin aspart  0-6 Units Subcutaneous TID WC   pantoprazole  40 mg Oral Q1200   Continuous Infusions: PRN Meds:.acetaminophen **OR** acetaminophen,  hydrALAZINE   I have personally reviewed following labs and imaging studies  LABORATORY DATA: CBC: Recent Labs  Lab 12/05/21 1653 12/06/21 0417  WBC 4.9 5.1  NEUTROABS 3.2  --   HGB 10.4* 9.3*  HCT 32.8* 29.4*  MCV 97.9 98.0  PLT 232 993    Basic Metabolic Panel: Recent Labs  Lab 12/05/21 1653 12/06/21 0417  NA 141 140  K 3.7 3.6  CL 105 105  CO2 26 25  GLUCOSE 138* 158*  BUN 32* 40*  CREATININE 3.91* 3.91*  CALCIUM 9.2 8.8*  MG 2.2  --   PHOS  --  3.8    GFR: Estimated Creatinine Clearance: 15.9 mL/min (A) (by C-G formula based on SCr of 3.91 mg/dL (H)).  Liver Function Tests: Recent Labs  Lab 12/06/21 0417  ALBUMIN 2.6*   No results for input(s): LIPASE, AMYLASE in the last 168 hours. No results for input(s): AMMONIA in the last 168 hours.  Coagulation Profile: No results for input(s): INR, PROTIME in the last 168 hours.  Cardiac Enzymes: No results for input(s): CKTOTAL, CKMB, CKMBINDEX, TROPONINI in the last 168 hours.  BNP (last 3 results) No results for input(s): PROBNP in the last 8760 hours.  Lipid Profile: No results for input(s): CHOL, HDL, LDLCALC, TRIG, CHOLHDL, LDLDIRECT in the last 72 hours.  Thyroid Function Tests: No results for input(s): TSH, T4TOTAL, FREET4, T3FREE, THYROIDAB in the last 72 hours.  Anemia Panel: No results for input(s): VITAMINB12, FOLATE, FERRITIN, TIBC, IRON, RETICCTPCT in the last 72 hours.  Urine analysis: No results found for: COLORURINE, APPEARANCEUR, LABSPEC, PHURINE, GLUCOSEU, HGBUR, BILIRUBINUR, KETONESUR, PROTEINUR, UROBILINOGEN, NITRITE, LEUKOCYTESUR  Sepsis Labs: Lactic Acid, Venous No results found for: LATICACIDVEN  MICROBIOLOGY: Recent Results (from the past 240 hour(s))  Resp Panel by RT-PCR (Flu A&B, Covid) Nasopharyngeal Swab     Status: None   Collection Time: 12/05/21 11:06 PM   Specimen: Nasopharyngeal Swab; Nasopharyngeal(NP) swabs in vial transport medium  Result Value Ref Range  Status   SARS Coronavirus 2 by RT PCR NEGATIVE NEGATIVE Final    Comment: (NOTE) SARS-CoV-2 target nucleic acids are NOT DETECTED.  The SARS-CoV-2 RNA is generally detectable in upper respiratory specimens during the acute phase of infection. The lowest concentration of SARS-CoV-2 viral copies this assay can detect is 138 copies/mL. A negative result does not preclude SARS-Cov-2 infection and should not be used as the sole basis for treatment or other patient management decisions. A negative result may occur with  improper specimen collection/handling, submission of specimen other than nasopharyngeal swab, presence of viral mutation(s) within the areas targeted by this assay, and inadequate number of viral copies(<138 copies/mL). A negative result must be combined with clinical observations, patient history, and epidemiological information. The expected result is Negative.  Fact Sheet for Patients:  EntrepreneurPulse.com.au  Fact Sheet for Healthcare Providers:  IncredibleEmployment.be  This test is no t yet approved or cleared by the [place] and  has been authorized  for detection and/or diagnosis of SARS-CoV-2 by FDA under an Emergency Use Authorization (EUA). This EUA will remain  in effect (meaning this test can be used) for the duration of the COVID-19 declaration under Section 564(b)(1) of the Act, 21 U.S.C.section 360bbb-3(b)(1), unless the authorization is terminated  or revoked sooner.       Influenza A by PCR NEGATIVE NEGATIVE Final   Influenza B by PCR NEGATIVE NEGATIVE Final    Comment: (NOTE) The Xpert Xpress SARS-CoV-2/FLU/RSV plus assay is intended as an aid in the diagnosis of influenza from Nasopharyngeal swab specimens and should not be used as a sole basis for treatment. Nasal washings and aspirates are unacceptable for Xpert Xpress SARS-CoV-2/FLU/RSV testing.  Fact Sheet for  Patients: EntrepreneurPulse.com.au  Fact Sheet for Healthcare Providers: IncredibleEmployment.be  This test is not yet approved or cleared by the [place] FDA and has been authorized for detection and/or diagnosis of SARS-CoV-2 by FDA under an Emergency Use Authorization (EUA). This EUA will remain in effect (meaning this test can be used) for the duration of the COVID-19 declaration under Section 564(b)(1) of the Act, 21 U.S.C. section 360bbb-3(b)(1), unless the authorization is terminated or revoked.  Performed at [facility], [place] [address], [place], [place] [address]     RADIOLOGY STUDIES/RESULTS: DG Chest 2 View  Result Date: 12/05/2021 CLINICAL DATA:  Chest pain, missed dialysis. EXAM: CHEST - 2 VIEW COMPARISON:  None. FINDINGS: Right-sided central venous catheter tip projects over the distal SVC. The heart is enlarged. There is no pleural effusion or pneumothorax. No focal lung infiltrate or pulmonary edema. No acute fractures. IMPRESSION: 1. Cardiomegaly. 2. No acute cardiopulmonary process. Electronically Signed   By: Ronney Asters M.D.   On: 12/05/2021 19:49   ECHOCARDIOGRAM COMPLETE  Result Date: 12/06/2021    ECHOCARDIOGRAM REPORT   Patient Name:   Susan Owen Date of Exam: 12/06/2021 Medical Rec #:  [id number]    Height:       64.0 in Accession #:    [id number]   Weight:       173.7 lb Date of Birth:  [birth year]    BSA:          1.843 m Patient Age:    20 years     BP:           158/63 mmHg Patient Gender: F            HR:           72 bpm. Exam Location:  Inpatient Procedure: 2D Echo, Cardiac Doppler and Color Doppler Indications:    R07.9* Chest pain, unspecified  History:        Patient has no prior history of Echocardiogram examinations.  Sonographer:    Bernadene Person RDCS Referring Phys: [id number] [place]  1. Left ventricular ejection fraction, by estimation, is 60 to 65%. The left ventricle has normal  function. The left ventricle has no regional wall motion abnormalities. Left ventricular diastolic parameters were normal.  2. Right ventricular systolic function is normal. The right ventricular size is normal. There is normal pulmonary artery systolic pressure.  3. Left atrial size was moderately dilated.  4. A small pericardial effusion is present. The pericardial effusion is circumferential.  5. The mitral valve is normal in structure. Mild mitral valve regurgitation.  6. The aortic valve is normal in structure. Aortic valve regurgitation is not visualized. FINDINGS  Left Ventricle: Left ventricular ejection fraction, by estimation, is 60 to 65%. The  left ventricle has normal function. The left ventricle has no regional wall motion abnormalities. The left ventricular internal cavity size was normal in size. There is  no left ventricular hypertrophy. Left ventricular diastolic parameters were normal. Right Ventricle: The right ventricular size is normal. Right vetricular wall thickness was not assessed. Right ventricular systolic function is normal. There is normal pulmonary artery systolic pressure. The tricuspid regurgitant velocity is 1.73 m/s, and with an assumed right atrial pressure of 3 mmHg, the estimated right ventricular systolic pressure is 73.2 mmHg. Left Atrium: Left atrial size was moderately dilated. Right Atrium: Right atrial size was normal in size. Pericardium: A small pericardial effusion is present. The pericardial effusion is circumferential. Mitral Valve: The mitral valve is normal in structure. Mild mitral valve regurgitation. Tricuspid Valve: The tricuspid valve is normal in structure. Tricuspid valve regurgitation is mild. Aortic Valve: The aortic valve is normal in structure. Aortic valve regurgitation is not visualized. Pulmonic Valve: The pulmonic valve was normal in structure. Pulmonic valve regurgitation is mild. Aorta: The aortic root and ascending aorta are structurally normal,  with no evidence of dilitation. IAS/Shunts: No atrial level shunt detected by color flow Doppler.  LEFT VENTRICLE PLAX 2D LVIDd:         4.90 cm     Diastology LVIDs:         3.30 cm     LV e' medial:    3.75 cm/s LV PW:         1.10 cm     LV E/e' medial:  35.5 LV IVS:        1.10 cm     LV e' lateral:   4.22 cm/s LVOT diam:     1.80 cm     LV E/e' lateral: 31.5 LV SV:         67 LV SV Index:   36 LVOT Area:     2.54 cm  LV Volumes (MOD) LV vol d, MOD A2C: 83.1 ml LV vol d, MOD A4C: 96.6 ml LV vol s, MOD A2C: 38.8 ml LV vol s, MOD A4C: 41.2 ml LV SV MOD A2C:     44.3 ml LV SV MOD A4C:     96.6 ml LV SV MOD BP:      50.4 ml RIGHT VENTRICLE RV S prime:     9.14 cm/s TAPSE (M-mode): 1.6 cm LEFT ATRIUM             Index        RIGHT ATRIUM           Index LA diam:        3.70 cm 2.01 cm/m   RA Area:     15.10 cm LA Vol (A2C):   74.7 ml 40.54 ml/m  RA Volume:   38.90 ml  21.11 ml/m LA Vol (A4C):   86.1 ml 46.73 ml/m LA Biplane Vol: 80.6 ml 43.74 ml/m  AORTIC VALVE LVOT Vmax:   118.00 cm/s LVOT Vmean:  86.500 cm/s LVOT VTI:    0.262 m  AORTA Ao Root diam: 3.30 cm Ao Asc diam:  3.60 cm MITRAL VALVE                TRICUSPID VALVE MV Area (PHT): 4.29 cm     TR Peak grad:   12.0 mmHg MV Decel Time: 177 msec     TR Vmax:        173.00 cm/s MV E velocity: 133.00 cm/s MV A velocity: 107.00 cm/s  SHUNTS MV E/A ratio:  1.24         Systemic VTI:  0.26 m                             Systemic Diam: 1.80 cm Dorris Carnes MD Electronically signed by Dorris Carnes MD Signature Date/Time: 12/06/2021/11:39:15 AM    Final      LOS: 0 days   Oren Binet, MD  [facility]    To contact the attending provider between 7A-7P or the covering provider during after hours 7P-7A, please log into the web site [email or web address] and access using universal [place] password for that web site. If you do not have the password, please call the hospital operator.  12/06/2021, 11:39 AM

## 2021-12-06 NOTE — Assessment & Plan Note (Addendum)
Resume outpatient follow-up with urology.  Defer voiding trial to the outpatient setting as this is a chronic issue.

## 2021-12-06 NOTE — Progress Notes (Signed)
Pt receives out-pt HD at [facility] out-pt clinic [phone]) on TTS. Pt is supposed to arrive at 11:45 for 12:00 chair time. Spoke to clinic SW who informed navigator that pt/pt's family are having housing difficulties at this time. Pt and pt's family are staying in a motel here in the [place] area. Dtr has had a difficult time getting [place]/from [place] due to cost of gas. Spoke to pt's dtr, Cyril Mourning, via phone. Dtr confirms housing difficulties and that she has not had the money to get pt to/[place]. Dtr had spoken to SW at clinic regarding getting [facility] changed to [facility] but that had not been pursued. Dtr is requesting that pt be clipped to [facility] if at all possible. Dtr is unsure how long it will be until she is able to secure permanent housing but states she cannot get [place] back[place]forth to [place]. Dtr agreeable to proceed with referral to [facility] [place] admissions in an attempt to get pt placed at local clinic. Referral submitted to [facility] today. Update provided to attending, renal PA, and RN CM. Dtr confirms pt/family will return to motel at d/c. Will assist as needed.   Melven Sartorius Renal Navigator [phone]

## 2021-12-06 NOTE — Hospital Course (Addendum)
Patient is a 61 y.o.  female with history of ESRD on HD, HTN, HLD-presented to the hospital for evaluation of chest pain.   Significant Hospital events: 2/1>> admit to [facility] for chest pain.  Significant imaging studies: 2/1>> CXR: No pneumonia 2/2>> Echo: EF 60-65% with no regional wall motion abnormalities  Significant microbiology data: 2/1>> COVID/flu PCR: Negative  Procedures: None  Consults:  Cardiology, nephrology, ophthalmology-Dr. Katy Fitch (phone consult), neurology

## 2021-12-06 NOTE — Progress Notes (Signed)
°  Echocardiogram 2D Echocardiogram has been performed.  Susan Owen 12/06/2021, 9:30 AM

## 2021-12-06 NOTE — Assessment & Plan Note (Signed)
Per daughter who I spoke to over the phone on 2/2-patient has been diagnosed with early onset dementia.  Patient appears to be a terrible historian at baseline.

## 2021-12-07 ENCOUNTER — Inpatient Hospital Stay (HOSPITAL_COMMUNITY): Payer: Medicare Other

## 2021-12-07 ENCOUNTER — Other Ambulatory Visit (HOSPITAL_COMMUNITY): Payer: Self-pay

## 2021-12-07 DIAGNOSIS — H539 Unspecified visual disturbance: Secondary | ICD-10-CM

## 2021-12-07 DIAGNOSIS — N186 End stage renal disease: Secondary | ICD-10-CM

## 2021-12-07 DIAGNOSIS — Z992 Dependence on renal dialysis: Secondary | ICD-10-CM

## 2021-12-07 LAB — RENAL FUNCTION PANEL
Albumin: 2.6 g/dL — ABNORMAL LOW (ref 3.5–5.0)
Anion gap: 6 (ref 5–15)
BUN: 52 mg/dL — ABNORMAL HIGH (ref 6–20)
CO2: 24 mmol/L (ref 22–32)
Calcium: 9.1 mg/dL (ref 8.9–10.3)
Chloride: 107 mmol/L (ref 98–111)
Creatinine, Ser: 4.52 mg/dL — ABNORMAL HIGH (ref 0.44–1.00)
GFR, Estimated: 11 mL/min — ABNORMAL LOW (ref >60)
Glucose, Bld: 111 mg/dL — ABNORMAL HIGH (ref 70–99)
Phosphorus: 3.5 mg/dL (ref 2.5–4.6)
Potassium: 3.9 mmol/L (ref 3.5–5.1)
Sodium: 137 mmol/L (ref 135–145)

## 2021-12-07 LAB — CBC
HCT: 29.7 % — ABNORMAL LOW (ref 36.0–46.0)
Hemoglobin: 9.4 g/dL — ABNORMAL LOW (ref 12.0–15.0)
MCH: 30.5 pg (ref 26.0–34.0)
MCHC: 31.6 g/dL (ref 30.0–36.0)
MCV: 96.4 fL (ref 80.0–100.0)
Platelets: 199 10*3/uL (ref 150–400)
RBC: 3.08 MIL/uL — ABNORMAL LOW (ref 3.87–5.11)
RDW: 14.9 % (ref 11.5–15.5)
WBC: 5.5 10*3/uL (ref 4.0–10.5)
nRBC: 0 % (ref 0.0–0.2)

## 2021-12-07 LAB — GLUCOSE, CAPILLARY
Glucose-Capillary: 109 mg/dL — ABNORMAL HIGH (ref 70–99)
Glucose-Capillary: 116 mg/dL — ABNORMAL HIGH (ref 70–99)
Glucose-Capillary: 101 mg/dL — ABNORMAL HIGH (ref 70–99)
Glucose-Capillary: 138 mg/dL — ABNORMAL HIGH (ref 70–99)

## 2021-12-07 LAB — HEPATITIS B SURFACE ANTIBODY,QUALITATIVE
Hep B S Ab: REACTIVE — AB
Hep B S Ab: REACTIVE — AB

## 2021-12-07 LAB — HEPATITIS B SURFACE ANTIGEN: Hepatitis B Surface Ag: NONREACTIVE

## 2021-12-07 LAB — HEPATITIS B CORE ANTIBODY, TOTAL: Hep B Core Total Ab: NONREACTIVE

## 2021-12-07 LAB — HEMOGLOBIN A1C
Hgb A1c MFr Bld: 5.3 % (ref 4.8–5.6)
Mean Plasma Glucose: 105 mg/dL

## 2021-12-07 IMAGING — MR MR HEAD W/O CM
8 of 10 series · 34 of 48 positions shown · non-contrast
Comparison: MRA head and MRA neck performed earlier today
[DATE].

CLINICAL DATA: Provided history: Neuro deficit, acute, stroke
suspected.

EXAM:
MRI HEAD WITHOUT CONTRAST
TECHNIQUE: Multiplanar, multiecho pulse sequences of the brain and surrounding
structures were obtained without intravenous contrast.

[Series 3: DWI · axial · 3.0mm · 1.09mm/px · z∈[-69,+72]mm · 9 of 96 slices shown (1 of 4)]
[im 1/96]
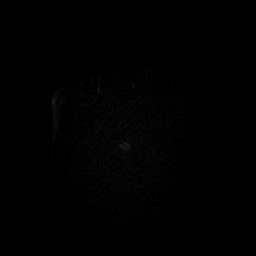
[im 12/96]
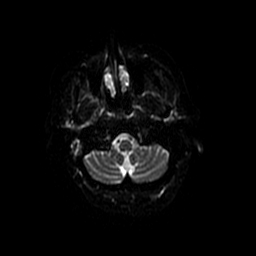
[im 24/96]
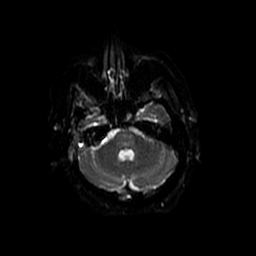
[im 36/96]
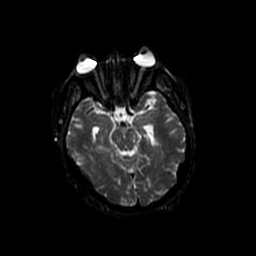
[im 48/96]
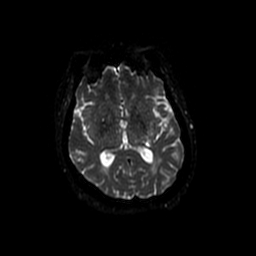
[im 60/96]
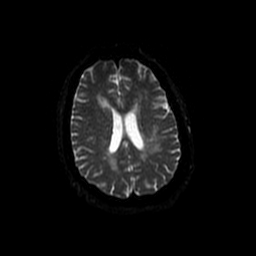
[im 72/96]
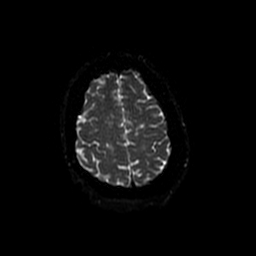
[im 84/96]
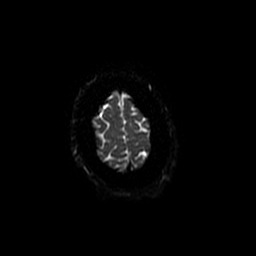
[im 96/96]
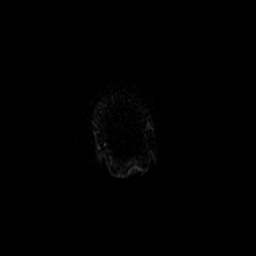

[Series 4: DWI · coronal · 5.0mm · 1.09mm/px · 7 of 68 slices shown (2 of 4)]
[im 1/68]
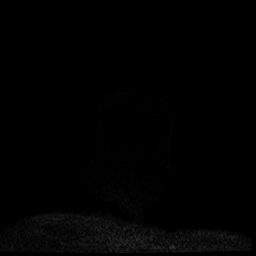
[im 12/68]
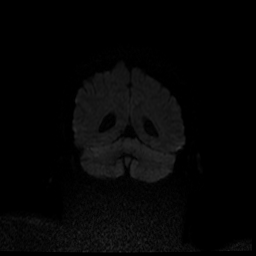
[im 23/68]
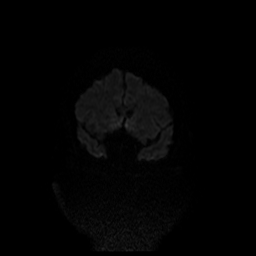
[im 34/68]
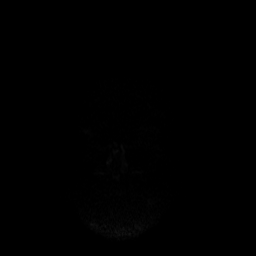
[im 45/68]
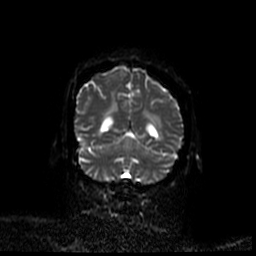
[im 56/68]
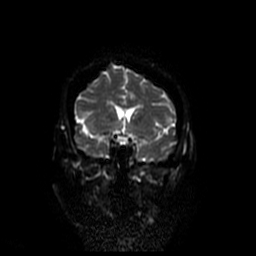
[im 68/68]
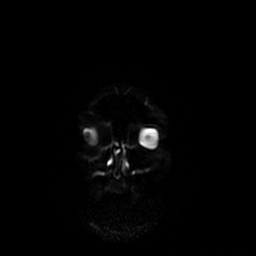

[Series 5: FLAIR · axial · 3.0mm · 0.47mm/px · z∈[-78,+66]mm · 3 of 25 slices shown]
[im 1/25]
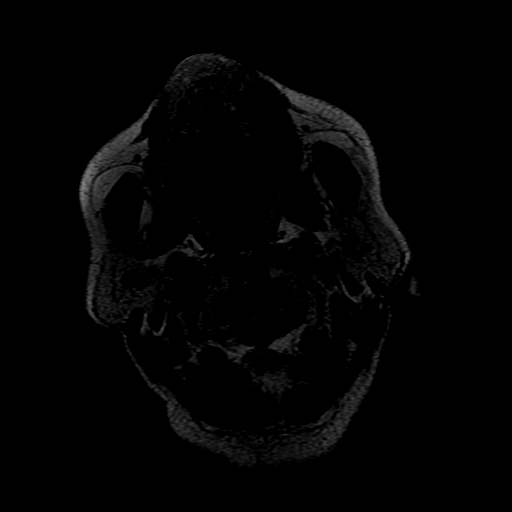
[im 13/25]
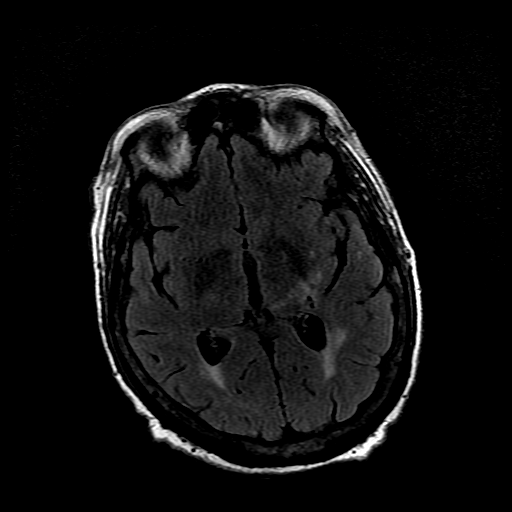
[im 25/25]
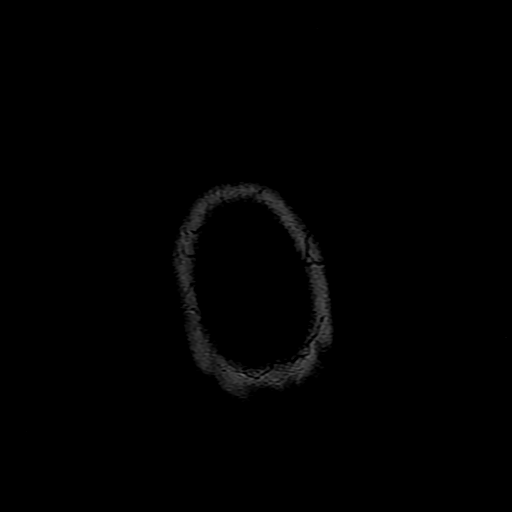

[Series 7: T1 · sagittal · 5.0mm · 0.47mm/px · 1 of 25 slices shown]
[im 1/25]
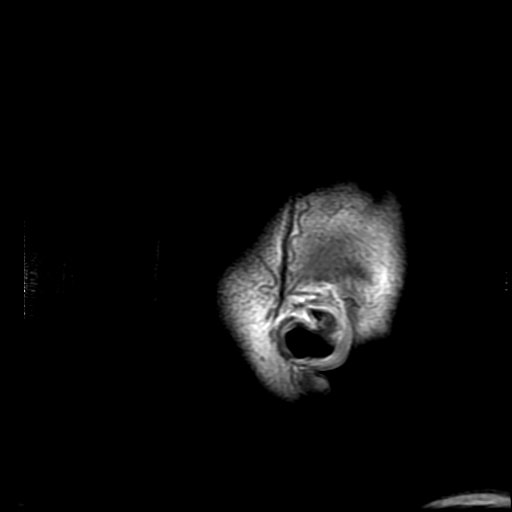

[Series 8: T2 · axial · 5.0mm · 0.47mm/px · z∈[-78,+66]mm · 3 of 25 slices shown (1 of 2)]
[im 1/25]
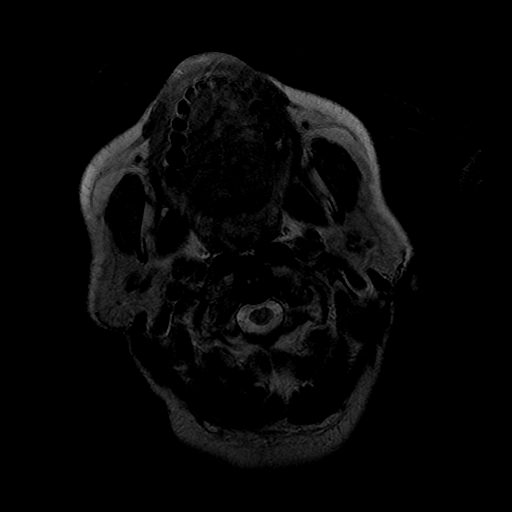
[im 13/25]
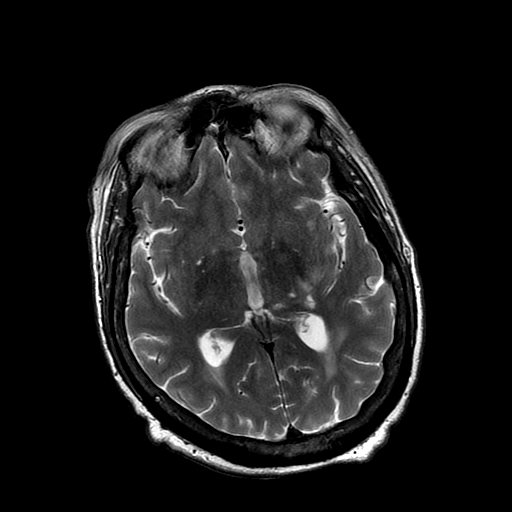
[im 25/25]
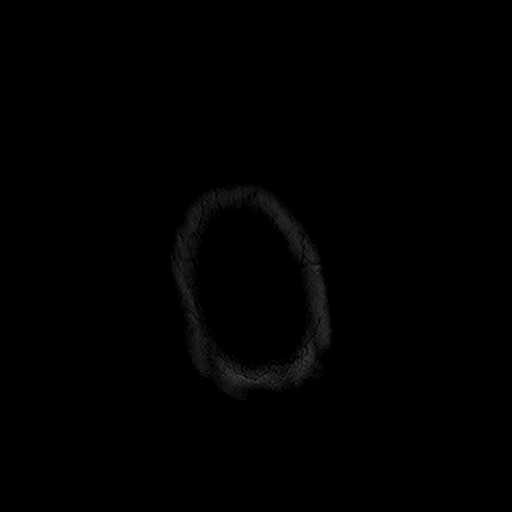

[Series 10: T2 · coronal · 5.0mm · 0.39mm/px · 3 of 26 slices shown (2 of 2)]
[im 1/26]
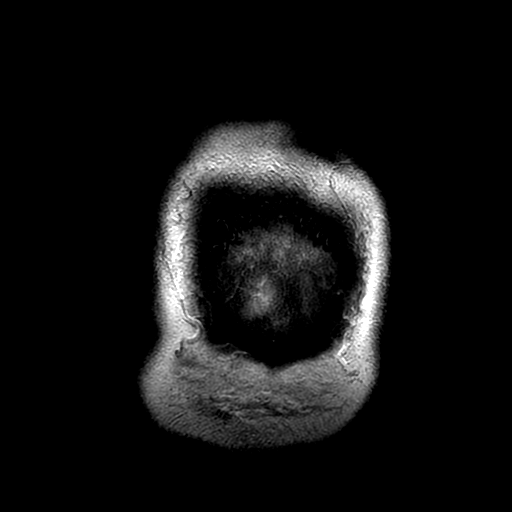
[im 13/26]
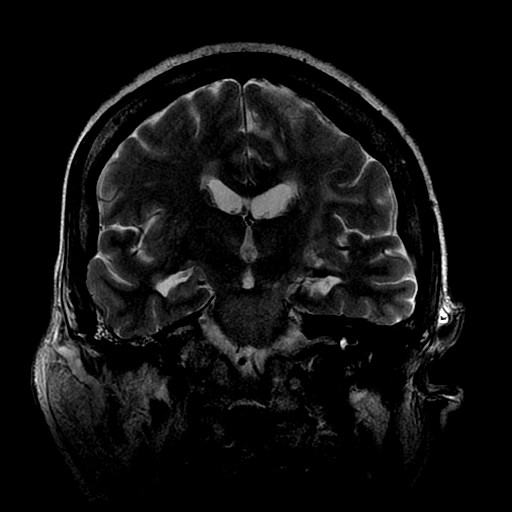
[im 26/26]
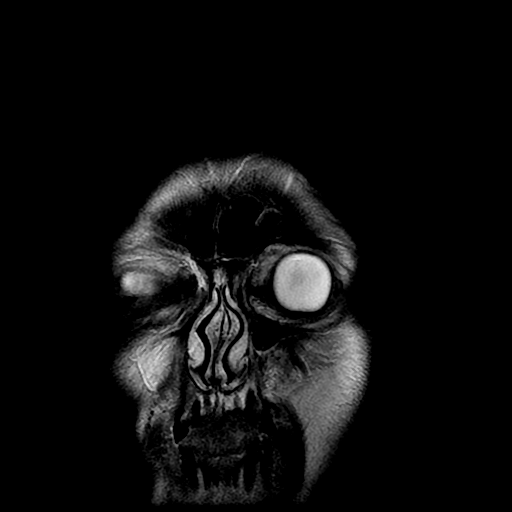

[Series 300: DWI · axial · 3.0mm · 1.09mm/px · z∈[-69,+72]mm · 5 of 48 slices shown (3 of 4)]
[im 1/48]
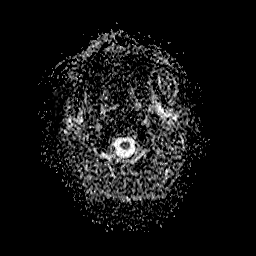
[im 12/48]
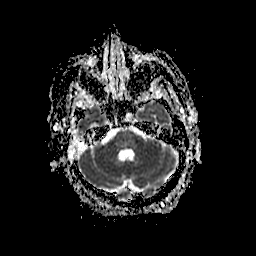
[im 24/48]
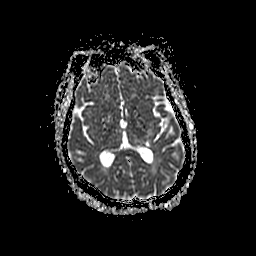
[im 36/48]
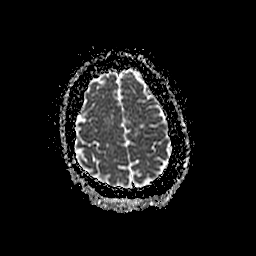
[im 48/48]
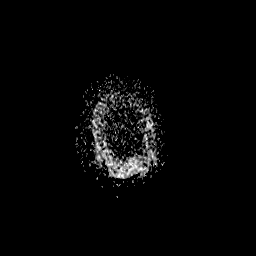

[Series 400: DWI · coronal · 5.0mm · 1.09mm/px · 3 of 34 slices shown (4 of 4)]
[im 1/34]
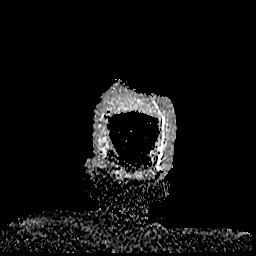
[im 17/34]
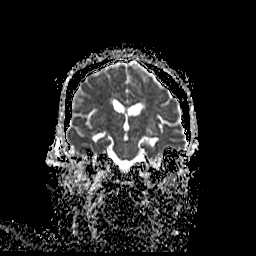
[im 34/34]
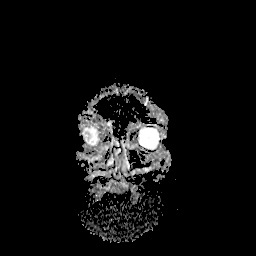

[34 of 48 positions shown; findings below may reference images not displayed]

FINDINGS: Brain:

Cerebral volume appears normal for age.

Small focus of cortical/subcortical T2 FLAIR hyperintense signal
abnormality within the mid to posterior right temporal lobe, which
may reflect posttraumatic encephalomalacia or a chronic infarct (for
instance as seen on series 5, image 10).

Additional small foci of cortical/subcortical T2 FLAIR hyperintense
signal abnormality within the right occipital lobe and right
parietal lobe (for instance as seen on series 5, images 11, 18 and
19). These likely reflect chronic infarcts.

Chronic small-vessel infarcts within the bilateral cerebral
hemispheric white matter, left basal ganglia, left thalamus and
posterior limb of left internal capsule.

Background moderate (but significantly age-advanced) multifocal T2
FLAIR hyperintense signal abnormality within the cerebral white
matter, nonspecific but likely reflecting chronic small vessel
ischemic disease given the patient's history of ESRD,
hyperlipidemia, hypertension and type 2 diabetes mellitus.

Extensive T2 FLAIR hyperintense signal abnormality within the pons.

There are fairly numerous supratentorial chronic microhemorrhages,
most notably within the right temporal lobe.

There is no acute infarct.

No evidence of an intracranial mass.

No extra-axial fluid collection.

No midline shift.

Vascular: Maintained flow voids within the proximal large arterial
vessels.

Skull and upper cervical spine: No focal suspicious marrow lesion.

Sinuses/Orbits: Visualized orbits show no acute finding. Mild
mucosal thickening within the bilateral ethmoid, sphenoid and
maxillary sinuses. Superimposed small mucous retention cyst within
the right maxillary sinus.

Other: Right mastoid effusion. Trace fluid also present within the
left mastoid air cells.

Impression #6 will be called to the ordering clinician or
representative by the Radiologist Assistant, and communication
documented in the PACS or [REDACTED].
IMPRESSION: 1. No evidence of acute infarction.
2. Small foci of cortical/subcortical T2 FLAIR hyperintense signal
abnormality within the right occipital and parietal lobes, likely
reflect chronic infarcts.
3. Small focus of cortical/subcortical T2 FLAIR hyperintense signal
abnormality within the mid to posterior right temporal lobe. This
may reflect posttraumatic encephalomalacia or a chronic infarct.
4. Chronic small-vessel infarcts within the bilateral cerebral
hemispheric white matter, left basal ganglia, left thalamus and
posterior limb of left internal capsule.
5. Background moderate (but significantly age-advanced) signal
changes within the cerebral white matter, nonspecific but likely
reflecting chronic small vessel ischemic disease given the patient's
history of ESRD, hyperlipidemia, hypertension and type 2 diabetes.
6. Prominent T2 FLAIR hyperintense signal changes within the pons,
which may also reflect chronic small vessel ischemic disease.
However, a component of posterior reversal encephalopathy syndrome
(PRES) cannot be excluded (particularly given the foci of
cortical/subcortical T2 FLAIR hyperintense signal abnormality
described above).
7. Fairly numerous supratentorial chronic microhemorrhages, which
may reflect sequela of chronic hypertensive microangiopathy and/or
cerebral amyloid angiopathy.
8. Paranasal sinus disease, as described.
9. Right mastoid effusion. Trace fluid also present within the left
mastoid air cells.

## 2021-12-07 IMAGING — MR MR MRA NECK W/O CM
4 of 5 series · 16 of 48 positions shown · non-contrast
Comparison: None.

CLINICAL DATA: Neuro deficit, acute, stroke suspected

EXAM:
MRA NECK WITHOUT CONTRAST
MRA HEAD WITHOUT CONTRAST
TECHNIQUE: Angiographic images of the Circle of Willis were acquired using MRA
technique without intravenous contrast.

[Series 6: ax (id) · axial · 2.8mm · 0.47mm/px · z∈[-275,-63]mm · 11 of 172 slices shown]
[im 10/172]
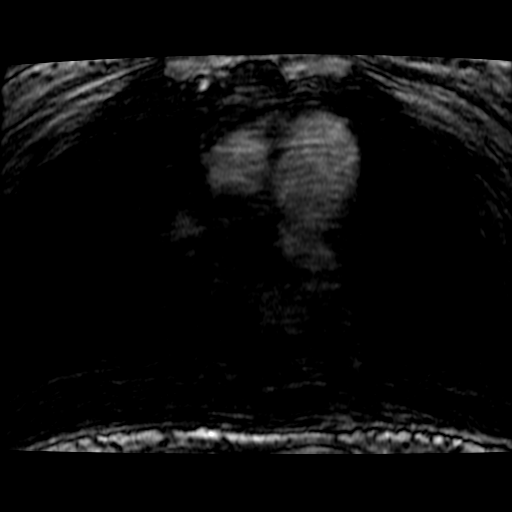
[im 28/172]
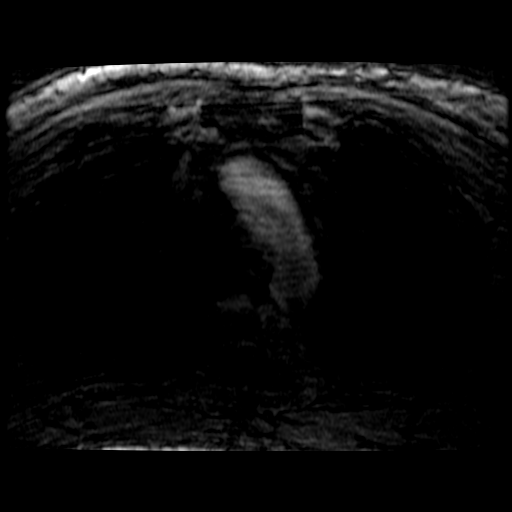
[im 32/172]
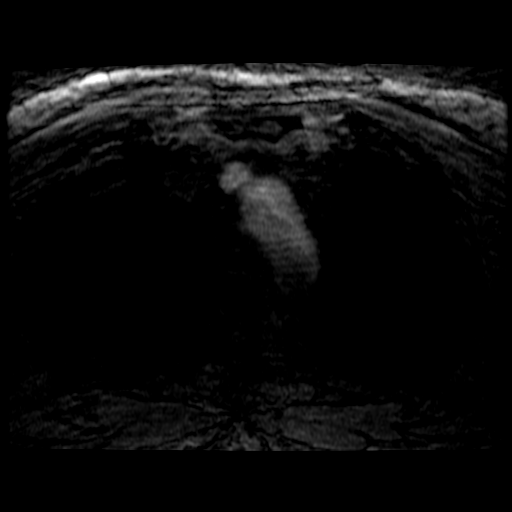
[im 55/172]
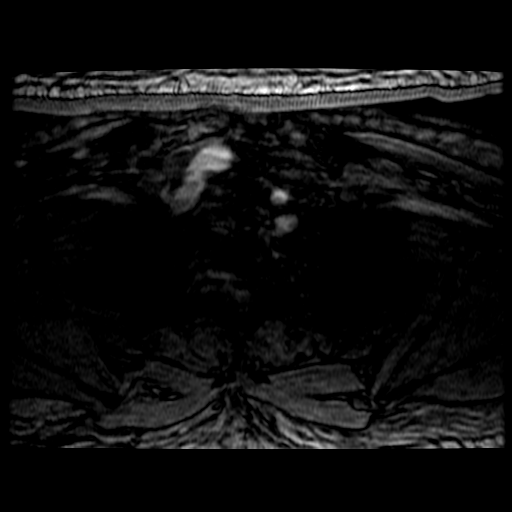
[im 77/172]
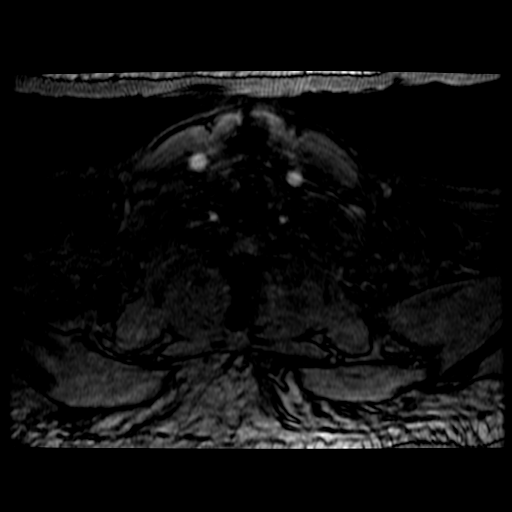
[im 86/172]
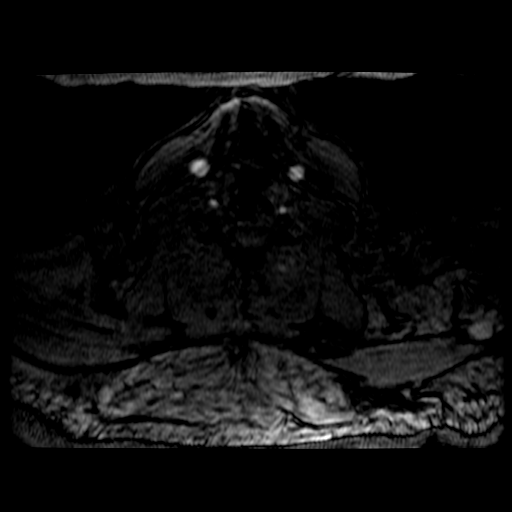
[im 95/172]
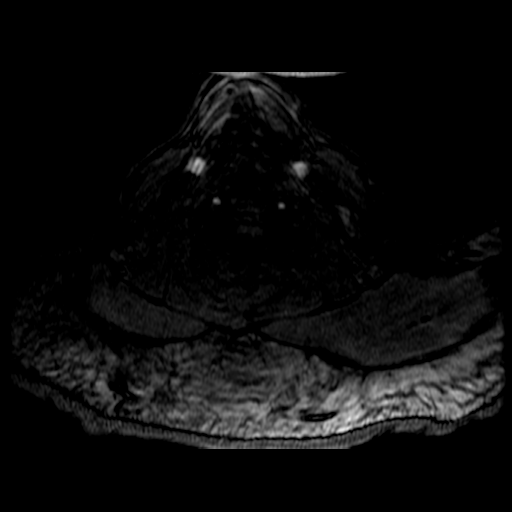
[im 118/172]
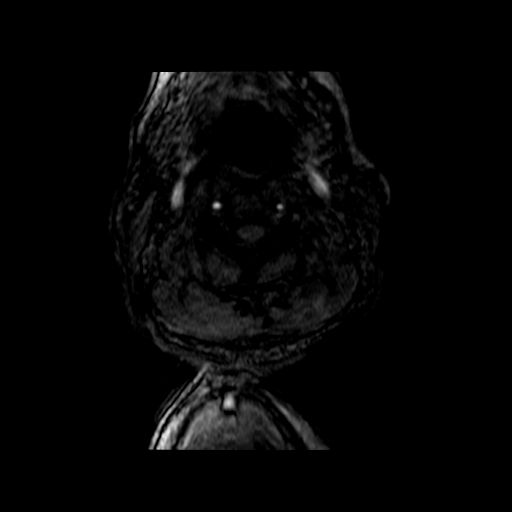
[im 140/172]
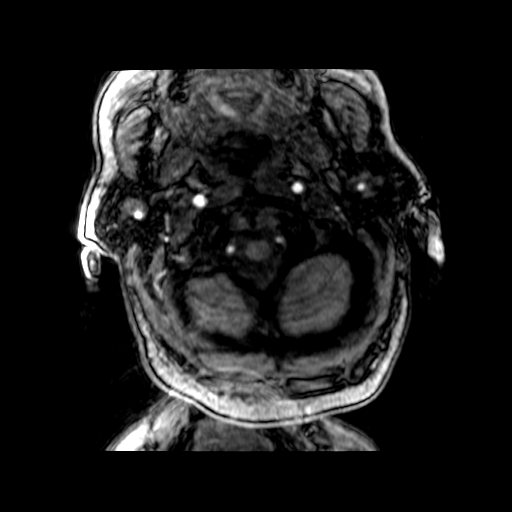
[im 145/172]
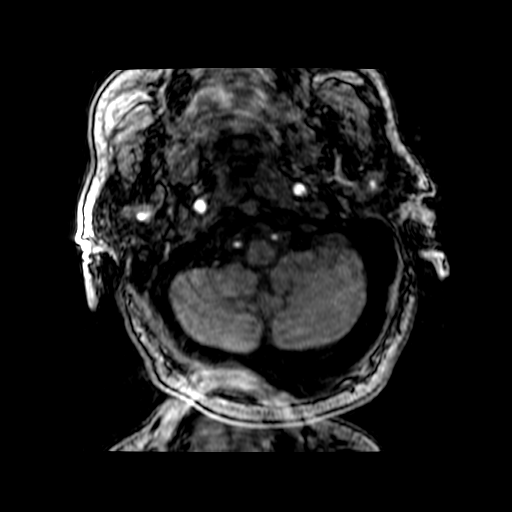
[im 163/172]
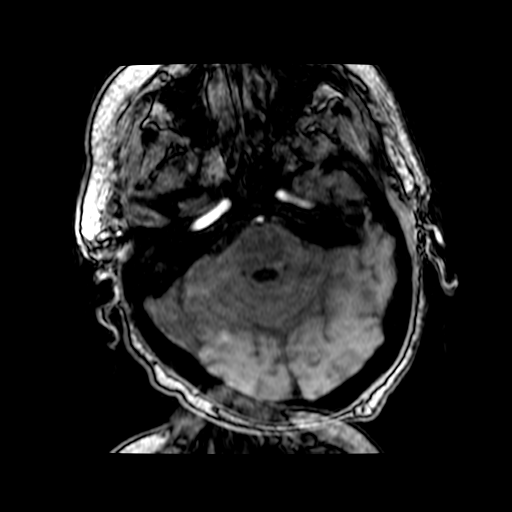

[Series 600: col:ax (id) · axial · 2.8mm · 0.47mm/px · 1 of 1 slices shown]
[im 1/1]
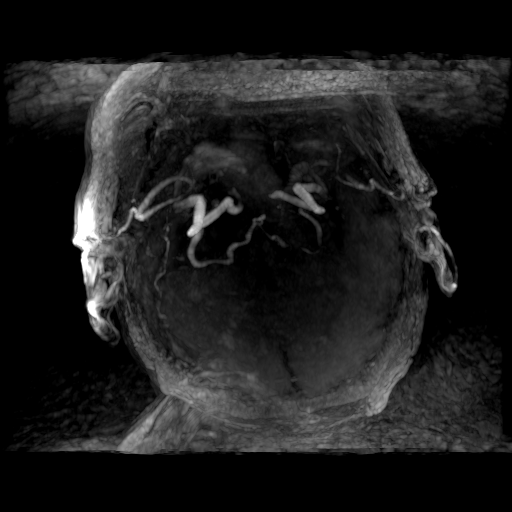

[Series 601: pjn:ax (id) · sagittal · 2.8mm · 0.47mm/px · 3 of 19 slices shown]
[im 1/19]
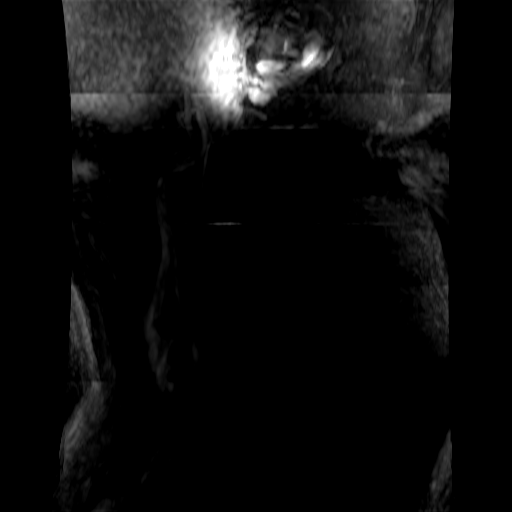
[im 13/19]
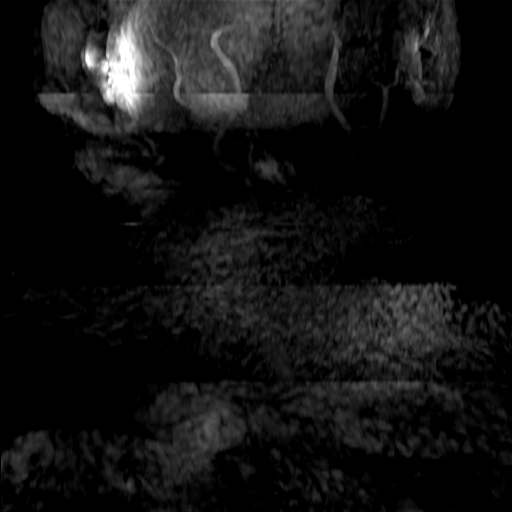
[im 19/19]
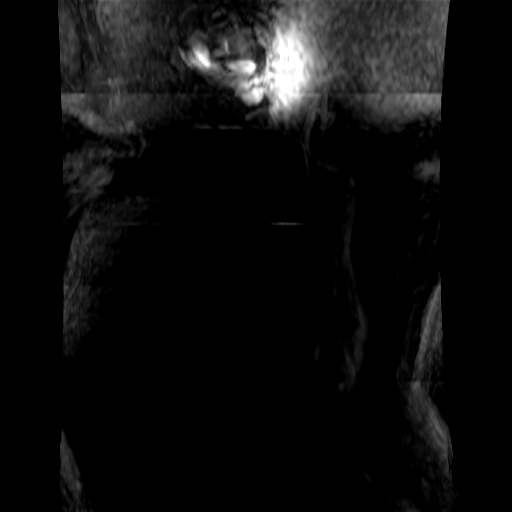

[Series 604: processed images · sagittal · 2.8mm · 0.24mm/px · 1 of 6 slices shown]
[im 1/6]
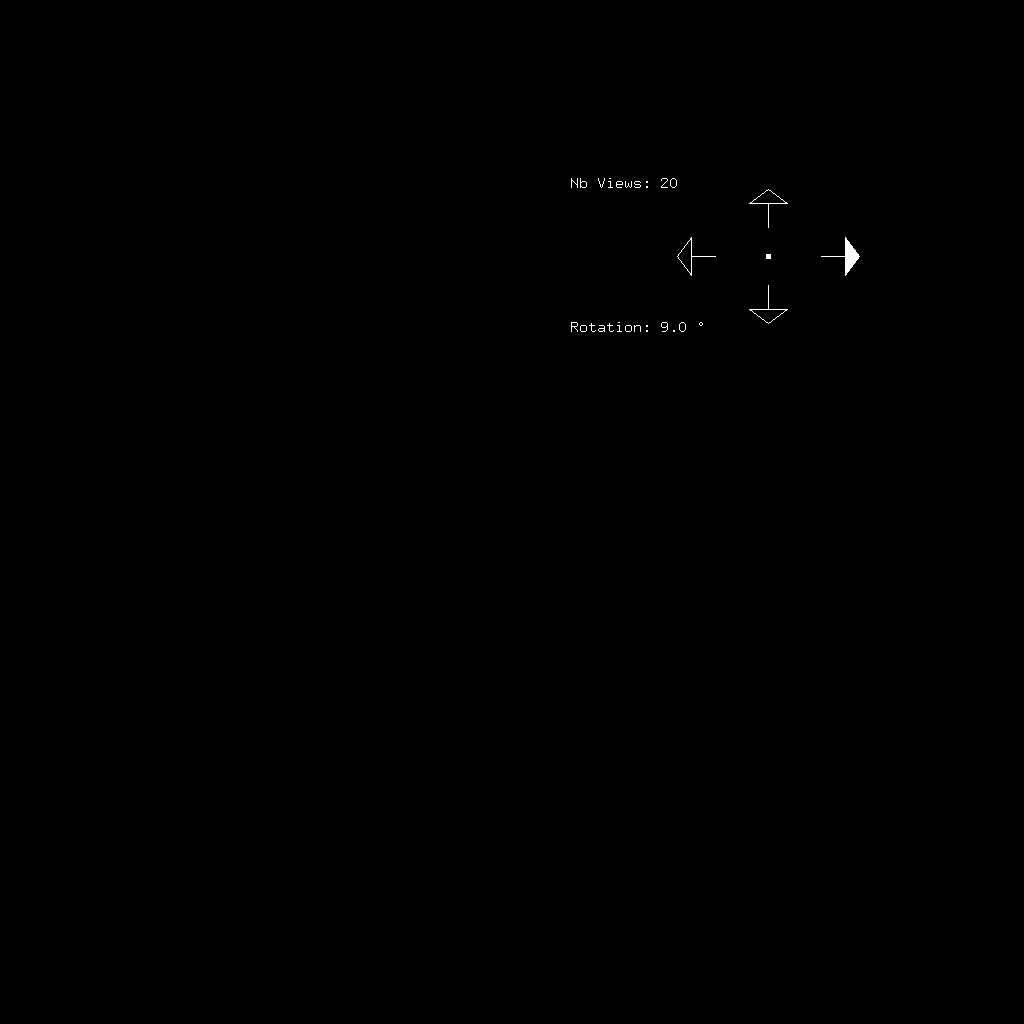

[16 of 48 positions shown; findings below may reference images not displayed]

FINDINGS: MRA NECK FINDINGS

Motion artifact is present. Common, internal, and external carotid
arteries are patent. Codominant extracranial vertebral arteries are
patent. No apparent hemodynamically significant stenosis.

MRA HEAD FINDINGS

Motion artifact is present. Internal carotid arteries are patent.
Proximal anterior and middle cerebral arteries are patent.
Intracranial vertebral arteries and basilar artery are patent.
Appears to be a right posterior communicating artery. Proximal right
posterior cerebral artery is patent. Proximal left posterior
cerebral artery is not well evaluated the on the P1 segment.
IMPRESSION: Significant motion degradation.

No large vessel occlusion or hemodynamically significant stenosis
identified in the neck.

No proximal intracranial vessel occlusion, noting the proximal left
posterior cerebral artery is not well evaluated.

## 2021-12-07 IMAGING — MR MR MRA HEAD W/O CM
4 of 5 series · 14 of 48 positions shown · non-contrast
Comparison: None.

CLINICAL DATA: Neuro deficit, acute, stroke suspected

EXAM:
MRA NECK WITHOUT CONTRAST
MRA HEAD WITHOUT CONTRAST
TECHNIQUE: Angiographic images of the Circle of [place] were acquired using MRA
technique without intravenous contrast.

[Series 3: (id) mt fs · axial · 1.4mm · 0.43mm/px · z∈[-66,+19]mm · 11 of 136 slices shown]
[im 7/136]
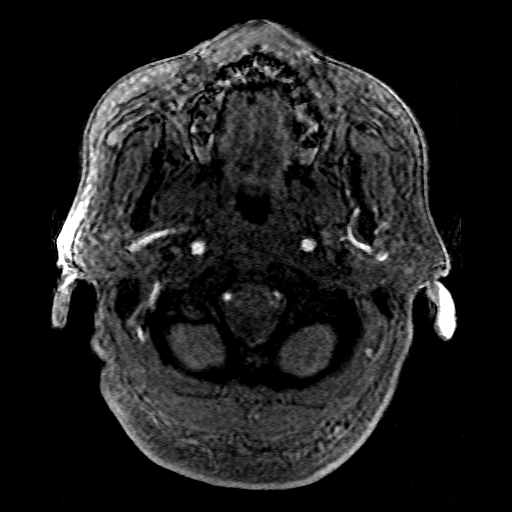
[im 22/136]
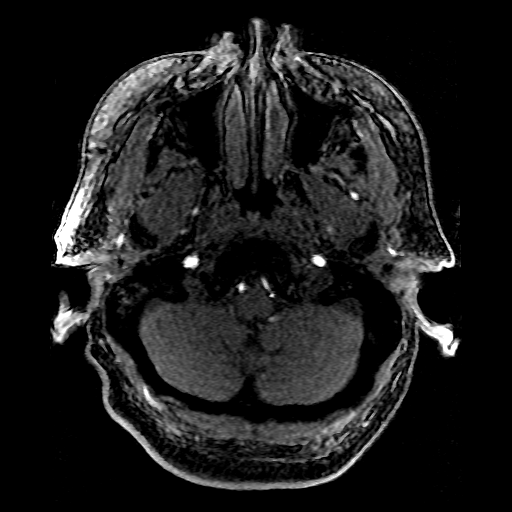
[im 26/136]
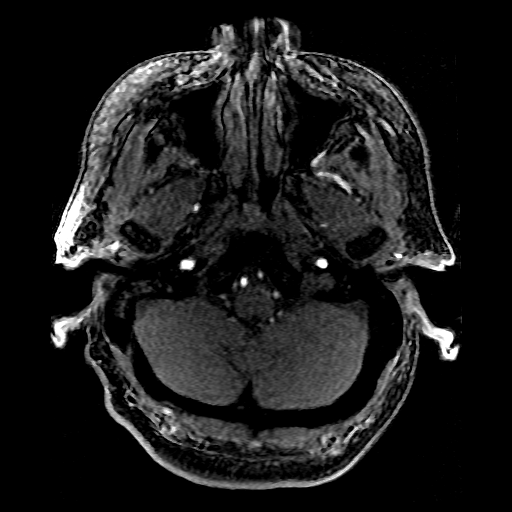
[im 41/136]
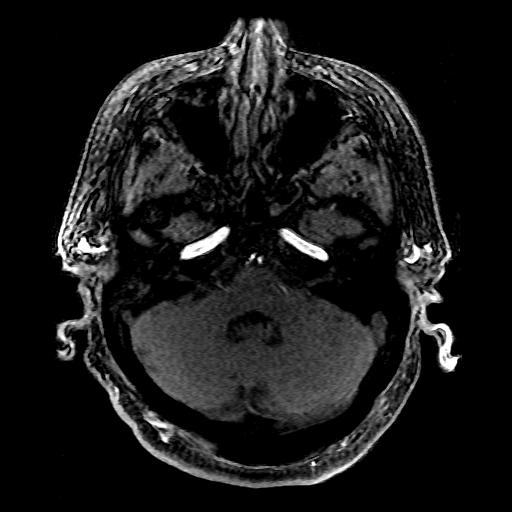
[im 60/136]
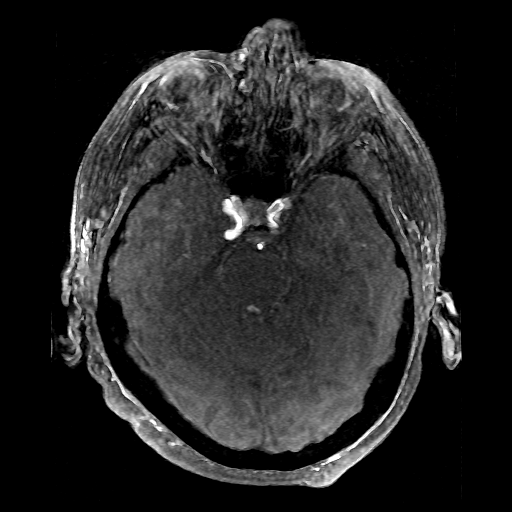
[im 70/136]
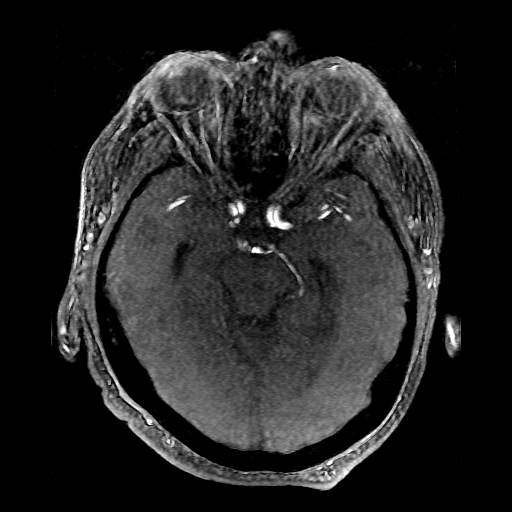
[im 76/136]
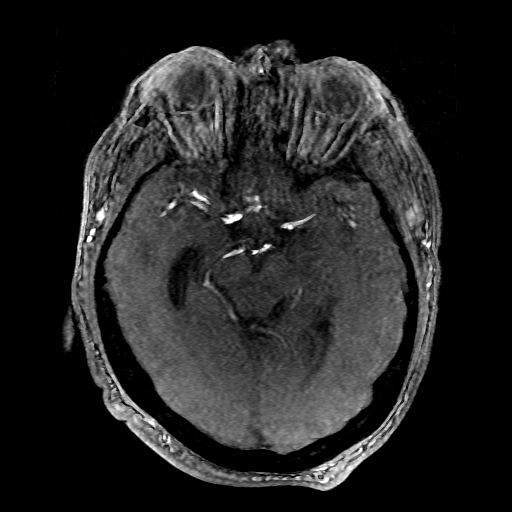
[im 95/136]
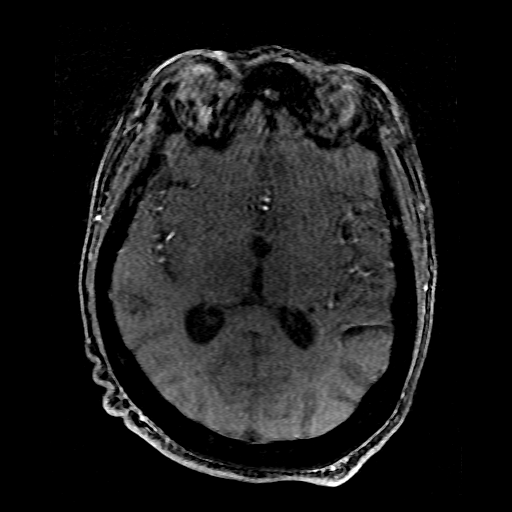
[im 110/136]
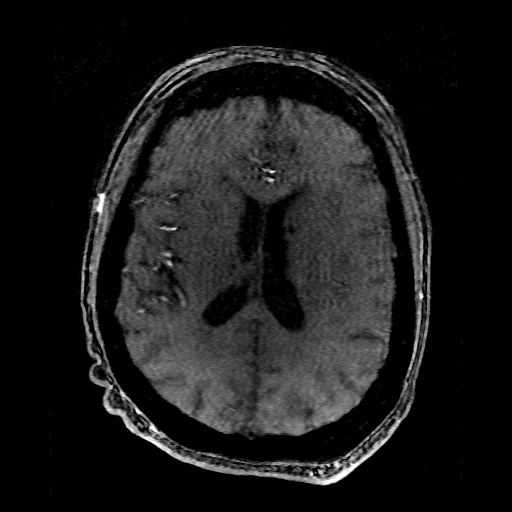
[im 114/136]
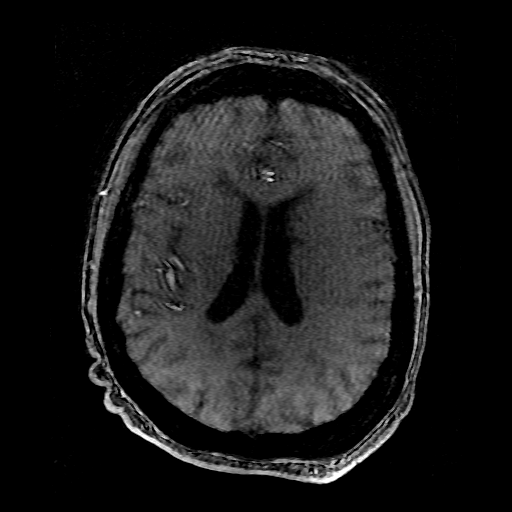
[im 129/136]
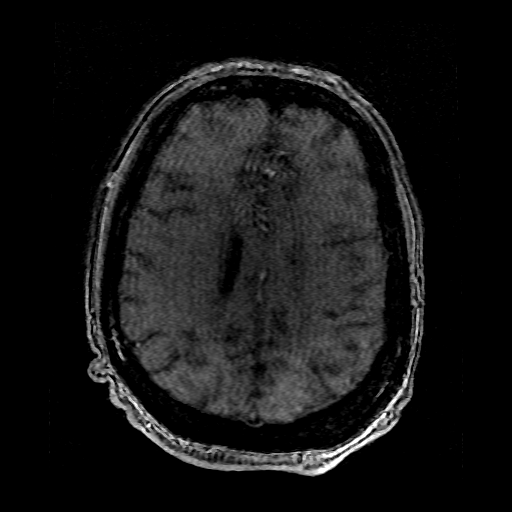

[Series 300: col:(id) mt fs · axial · 1.4mm · 0.43mm/px · 1 of 1 slices shown]
[im 1/1]
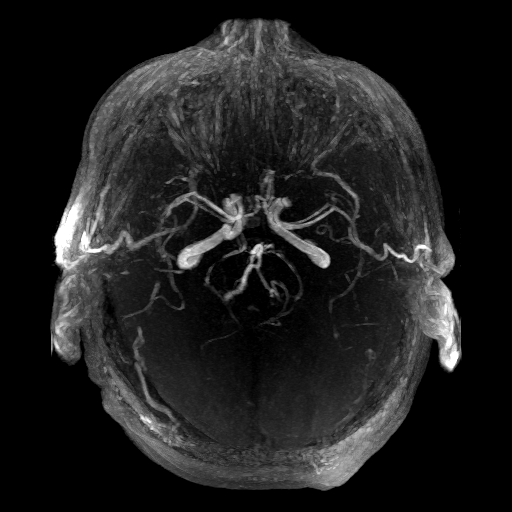

[Series 301: projection images · axial · 1.4mm · 0.21mm/px · 1 of 1 slices shown]
[im 1/1]
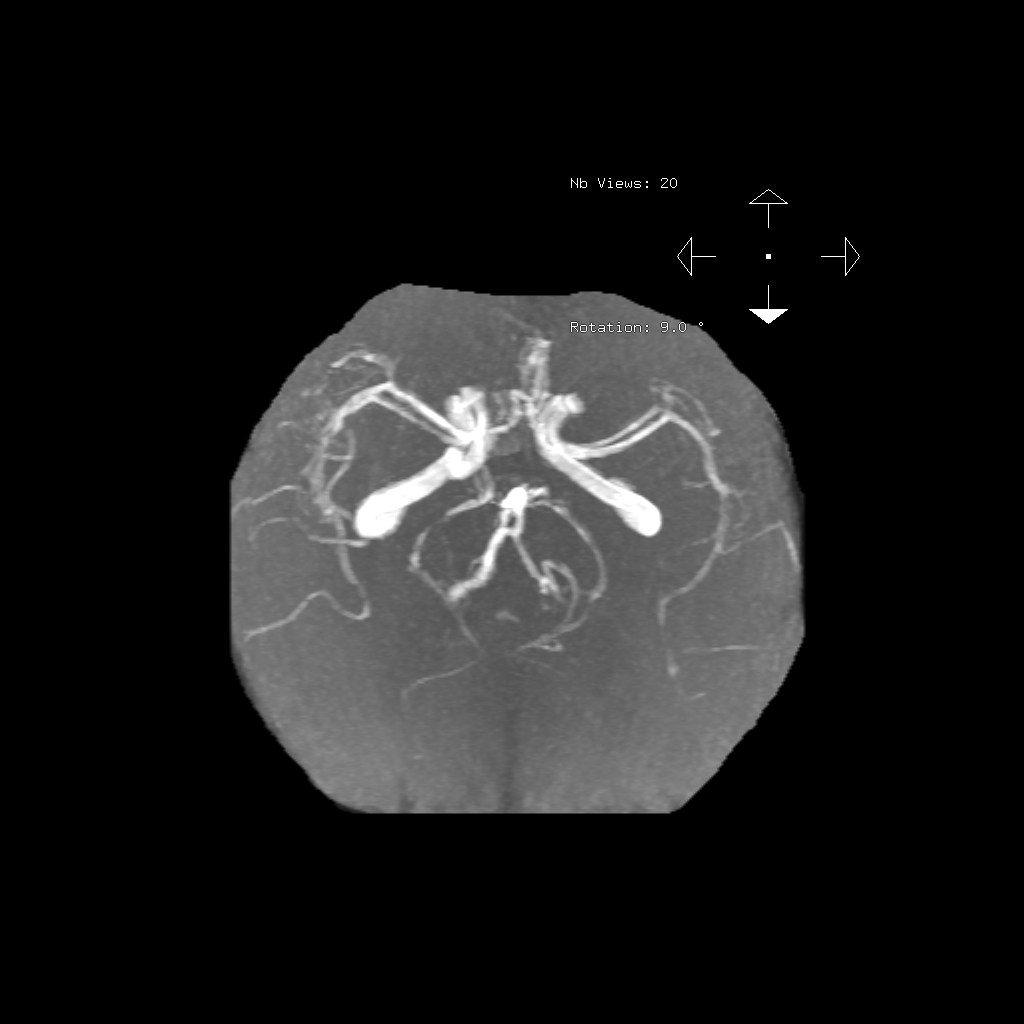

[Series 303: processed images · axial · 1.4mm · 0.21mm/px · 1 of 1 slices shown]
[im 1/1]
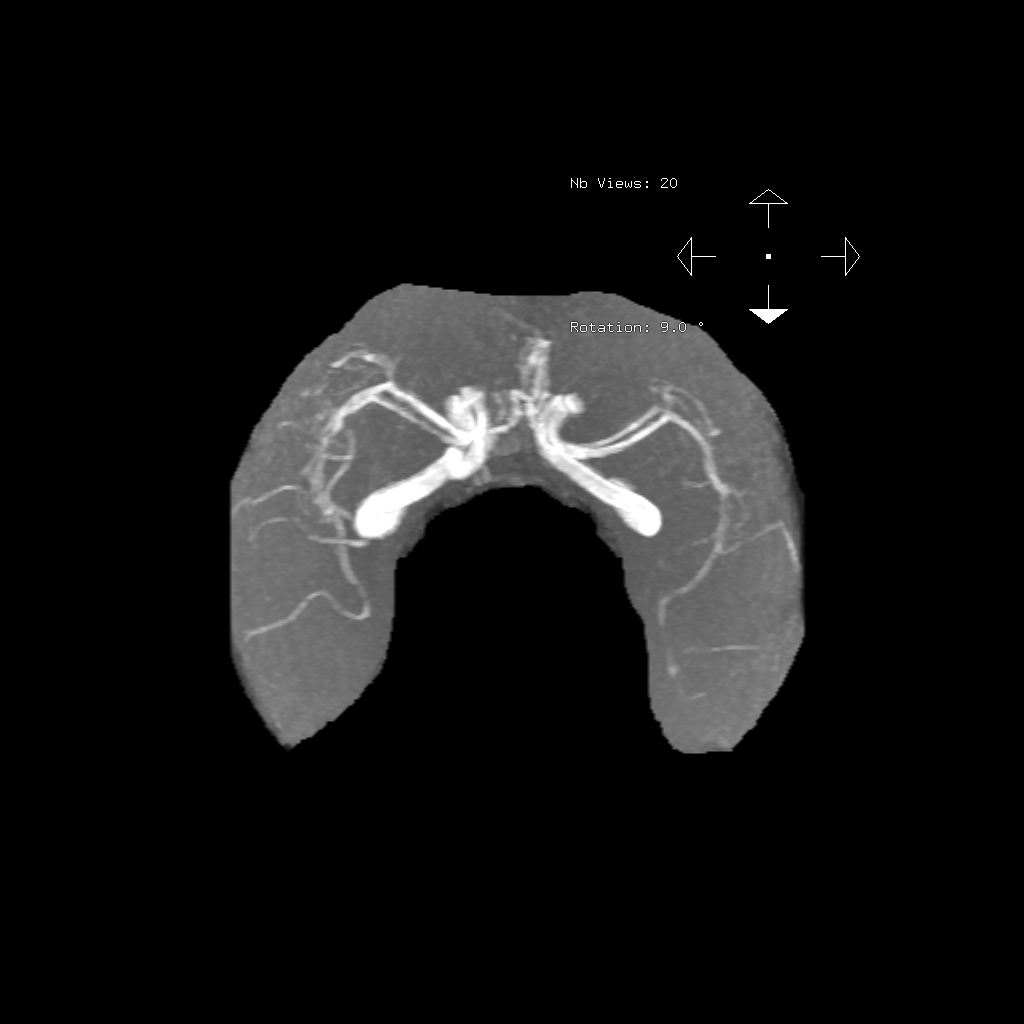

[14 of 48 positions shown; findings below may reference images not displayed]

FINDINGS: MRA NECK FINDINGS

Motion artifact is present. Common, internal, and external carotid
arteries are patent. Codominant extracranial vertebral arteries are
patent. No apparent hemodynamically significant stenosis.

MRA HEAD FINDINGS

Motion artifact is present. Internal carotid arteries are patent.
Proximal anterior and middle cerebral arteries are patent.
Intracranial vertebral arteries and basilar artery are patent.
Appears to be a right posterior communicating artery. Proximal right
posterior cerebral artery is patent. Proximal left posterior
cerebral artery is not well evaluated the on the P1 segment.
IMPRESSION: Significant motion degradation.

No large vessel occlusion or hemodynamically significant stenosis
identified in the neck.

No proximal intracranial vessel occlusion, noting the proximal left
posterior cerebral artery is not well evaluated.

## 2021-12-07 MED ORDER — PANTOPRAZOLE SODIUM 40 MG PO TBEC
40.0000 mg | DELAYED_RELEASE_TABLET | Freq: Every day | ORAL | 3 refills | Status: DC
  Filled 2021-12-07: qty 30, 30d supply, fill #0

## 2021-12-07 MED ORDER — FLUOXETINE HCL 20 MG PO CAPS
20.0000 mg | ORAL_CAPSULE | Freq: Every day | ORAL | 3 refills | Status: DC
  Filled 2021-12-07: qty 30, 30d supply, fill #0

## 2021-12-07 MED ORDER — AMLODIPINE BESYLATE 10 MG PO TABS
10.0000 mg | ORAL_TABLET | Freq: Every day | ORAL | 2 refills | Status: DC
  Filled 2021-12-07: qty 30, 30d supply, fill #0

## 2021-12-07 MED ORDER — HYDRALAZINE HCL 25 MG PO TABS
25.0000 mg | ORAL_TABLET | Freq: Two times a day (BID) | ORAL | 3 refills | Status: DC
  Filled 2021-12-07: qty 60, 30d supply, fill #0

## 2021-12-07 MED ORDER — ONDANSETRON HCL 4 MG/2ML IJ SOLN
4.0000 mg | Freq: Once | INTRAMUSCULAR | Status: AC
  Administered 2021-12-07: 4 mg via INTRAVENOUS
  Filled 2021-12-07: qty 2

## 2021-12-07 MED ORDER — LEVOTHYROXINE SODIUM 25 MCG PO TABS
25.0000 ug | ORAL_TABLET | Freq: Every day | ORAL | 3 refills | Status: DC
  Filled 2021-12-07: qty 30, 30d supply, fill #0

## 2021-12-07 MED ORDER — CARVEDILOL 12.5 MG PO TABS
12.5000 mg | ORAL_TABLET | Freq: Two times a day (BID) | ORAL | 3 refills | Status: DC
  Filled 2021-12-07: qty 60, 30d supply, fill #0

## 2021-12-07 MED ORDER — ATORVASTATIN CALCIUM 40 MG PO TABS
40.0000 mg | ORAL_TABLET | Freq: Every day | ORAL | 3 refills | Status: AC
Start: 2021-12-07 — End: ?
  Filled 2021-12-07: qty 30, 30d supply, fill #0

## 2021-12-07 NOTE — Progress Notes (Signed)
Discussed with social worker/Dr Patel-very difficult situation-patient living in a motel-has significant issues with transportation to her dialysis center in [place].  Efforts ongoing to switch her to a local dialysis center that is closer to her motel.  In the meantime-we will agree that given that she has dementia/frail-and will be missing HD-this is an unsafe situation.  Hold discharge for now-and will await further input from Education officer, museum.

## 2021-12-07 NOTE — Progress Notes (Signed)
Able to complete Brain MRA and Neck MRA exams, upon beginning Brain MRI imaging, pt squeezed provided emergency squeeze ball and began screaming to get out of the scanner. Pt removed from the scanner. Pt seemed to become nauseous and was provided a bag. Pt stated they were unable to complete the exam. Pt became agitated yelling to get me out of here and began attempting to get off the imaging table. Pt removed from scanner room and transferred back to bed. Pt sent back to room. RN made aware.  Unable to obtain Brain MRI imaging. Completed both MRA exams.

## 2021-12-07 NOTE — Consult Note (Signed)
NEUROLOGY CONSULTATION NOTE   Date of service: December 08, 2021 Patient Name: Susan Owen MRN:  [id number] DOB:  04/17/1961 Reason for consult: "new onset R eye vision loss" Requesting Provider: Jonetta Osgood, MD _ _ _   _ __   _ __ _ _  __ __   _ __   __ _  History of Present Illness  Susan Owen is a 61 y.o. female with PMH significant for ESRD on HD, HTN, HLD, DM2, hypothyroidism, dementia, positive Hep B sAg who is admitted for workup of chest pain.  There was concern for new onset blurry vision in R eye. She was evaluated by hospitalist team and noted to only be able to make out shapes with her R eye and able to count fingers. Given her dementia, attempted to contact family to clarify the duration of her symptoms but unable to get in touch with them.  Patient reports trouble with vision in both eyes. This has been going on for atleast a year per patient. R eye is worse than left. No pain in the eyes, no pain with movements of her eyes. She can barely make out the shape of objects from her right eyes. She is able to count finger with her left eye and has significant tunneling of her vision.  She tries to keep her R eye closed, helps her see better.  She had MRI brain which demonstrates chronic infarcts, small vessel disease, prominent FLAIR hyperintensities in pons which appear likely to be small vessel disease but difficult to rule out PRES. She also has numerous chronic microhemorrhages.   ROS   Constitutional Denies weight loss, fever and chills.   HEENT Endorses vision abnormalities as above, no problems with hearing.   Respiratory Denies SOB and cough.  CV Denies palpitations and CP  GI Denies abdominal pain, nausea, vomiting and diarrhea.  GU Denies dysuria and urinary frequency.  MSK Denies myalgia and joint pain.  Skin Denies rash and pruritus.  Neurological Denies headache and syncope.  Psychiatric Denies recent changes in mood. Denies anxiety and depression.    Past History   Past Medical History:  Diagnosis Date   Diabetes ([place])    ESRD (end stage renal disease) on dialysis ([place])    Hyperlipidemia    Hypertension    Renal disorder     Family History  Problem Relation Age of Onset   Stroke Neg Hx    Social History   Socioeconomic History   Marital status: Widowed    Spouse name: Not on file   Number of children: Not on file   Years of education: Not on file   Highest education level: Not on file  Occupational History   Not on file  Tobacco Use   Smoking status: Not on file   Smokeless tobacco: Not on file  Substance and Sexual Activity   Alcohol use: Not on file   Drug use: Not on file   Sexual activity: Not on file  Other Topics Concern   Not on file  Social History Narrative   Not on file   Social Determinants of Health   Financial Resource Strain: Not on file  Food Insecurity: Not on file  Transportation Needs: Not on file  Physical Activity: Not on file  Stress: Not on file  Social Connections: Not on file   Allergies  Allergen Reactions   Coumadin [Warfarin Sodium] Other (See Comments)    Avoid all oral anticoagulation medications related to cerebral amyloid angiopathy  Nasal Spray     Other reaction(s): Other Other reaction(s): Cutaneous reactions   Promethazine     Restless legs   Tolmetin     Medications   Medications Prior to Admission  Medication Sig Dispense Refill Last Dose   aspirin 81 MG chewable tablet Chew 81 mg by mouth daily as needed for mild pain.   Past Week   labetalol (NORMODYNE) 100 MG tablet Take 100 mg by mouth 2 (two) times daily.   12/05/2021 at 1100   torsemide (DEMADEX) 100 MG tablet Take 100 mg by mouth daily.   12/05/2021   [DISCONTINUED] amLODipine (NORVASC) 10 MG tablet Take 10 mg by mouth daily.   12/05/2021   [DISCONTINUED] atorvastatin (LIPITOR) 40 MG tablet Take 40 mg by mouth daily.   12/05/2021   [DISCONTINUED] FLUoxetine (PROZAC) 20 MG capsule Take 20 mg by mouth  daily.   12/05/2021   [DISCONTINUED] hydrALAZINE (APRESOLINE) 25 MG tablet Take 25 mg by mouth 2 (two) times daily.   12/05/2021   [DISCONTINUED] levothyroxine (SYNTHROID) 25 MCG tablet Take 25 mcg by mouth daily.   12/05/2021     Vitals   Vitals:   12/07/21 2333 12/07/21 2344 12/08/21 0004 12/08/21 0212  BP: 137/64 (!) 164/61 (!) 138/56 (!) 184/73  Pulse:  67    Resp: 18 13 17  (!) 22  Temp:      TempSrc:      SpO2:  100%    Weight:      Height:         Body mass index is 30.69 kg/m.  Physical Exam   General: Laying comfortably in bed; in no acute distress.  HENT: Normal oropharynx and mucosa. Normal external appearance of ears and nose.  Neck: Supple, no pain or tenderness  CV: No JVD. No peripheral edema.  Pulmonary: Symmetric Chest rise. Normal respiratory effort.  Abdomen: Soft to touch, non-tender.  Ext: No cyanosis, edema, T toes amputation Skin: No rash. Normal palpation of skin.   Musculoskeletal: Normal digits and nails by inspection. No clubbing. Decreased muscle bulk in R thigh compared to L.  Neurologic Examination  Mental status/Cognition: Alert, oriented to self, place, month and year, good attention.  Speech/language: Fluent, comprehension intact, object naming intact, repetition intact.  Cranial nerves:   CN II Pupils 13mm BL, very little response to light, R eye decreased visual acuity and can barely make out shapes. L eye tunneling of vision but can count finger, identify colors and read large letters.   CN III,IV,VI EOM intact, no gaze preference or deviation, no nystagmus   CN V normal sensation in V1, V2, and V3 segments bilaterally    CN VII no asymmetry, no nasolabial fold flattening    CN VIII normal hearing to speech    CN IX & X normal palatal elevation, no uvular deviation    CN XI 5/5 head turn and 5/5 shoulder shrug bilaterally    CN XII midline tongue protrusion    Motor:  Muscle bulk: normal, tone normal. Mvmt Root Nerve  Muscle Right Left  Comments  SA C5/6 Ax Deltoid 4+ 4+   EF C5/6 Mc Biceps 4+ 5   EE C6/7/8 Rad Triceps 4+ 5   WF C6/7 Med FCR     WE C7/8 PIN ECU     F Ab C8/T1 U ADM/FDI 4+ 5   HF L1/2/3 Fem Illopsoas 4+    KE L2/3/4 Fem Quad     DF L4/5 D Peron Tib Ant  PF S1/2 Tibial Grc/Sol      Reflexes:  Right Left Comments  Pectoralis      Biceps (C5/6) 2 2   Brachioradialis (C5/6) 2 2    Triceps (C6/7) 2 2    Patellar (L3/4) 2 2    Achilles (S1)      Hoffman      Plantar     Jaw jerk    Sensation:  Light touch Increased in RUE compared to LUE   Pin prick    Temperature    Vibration   Proprioception    Coordination/Complex Motor:  - Finger to Nose intact BL - Heel to shin intact BL - Rapid alternating movement are normal - Gait: deferred for patient safety.  Labs   CBC:  Recent Labs  Lab 12/05/21 1653 12/06/21 0417 12/07/21 0256  WBC 4.9 5.1 5.5  NEUTROABS 3.2  --   --   HGB 10.4* 9.3* 9.4*  HCT 32.8* 29.4* 29.7*  MCV 97.9 98.0 96.4  PLT 232 202 409    Basic Metabolic Panel:  Lab Results  Component Value Date   NA 137 12/07/2021   K 3.9 12/07/2021   CO2 24 12/07/2021   GLUCOSE 111 (H) 12/07/2021   BUN 52 (H) 12/07/2021   CREATININE 4.52 (H) 12/07/2021   CALCIUM 9.1 12/07/2021   GFRNONAA 11 (L) 12/07/2021   Lipid Panel: No results found for: LDLCALC HgbA1c:  Lab Results  Component Value Date   HGBA1C 5.3 12/06/2021   Urine Drug Screen: No results found for: LABOPIA, COCAINSCRNUR, LABBENZ, AMPHETMU, THCU, LABBARB  Alcohol Level No results found for: [facility]  MR Angio head without contrast and Carotid Duplex BL(Personally reviewed): Motion degraded, no LVO. Difficult to evaluate L PCA.  MRI Brain(Personally reviewed): 1. No evidence of acute infarction. 2. Small foci of cortical/subcortical T2 FLAIR hyperintense signal abnormality within the right occipital and parietal lobes, likely reflect chronic infarcts. 3. Small focus of cortical/subcortical T2 FLAIR  hyperintense signal abnormality within the mid to posterior right temporal lobe. This may reflect posttraumatic encephalomalacia or a chronic infarct. 4. Chronic small-vessel infarcts within the bilateral cerebral hemispheric white matter, left basal ganglia, left thalamus and posterior limb of left internal capsule. 5. Background moderate (but significantly age-advanced) signal changes within the cerebral white matter, nonspecific but likely reflecting chronic small vessel ischemic disease given the patient's history of ESRD, hyperlipidemia, hypertension and type 2 diabetes. 6. Prominent T2 FLAIR hyperintense signal changes within the pons, which may also reflect chronic small vessel ischemic disease. However, a component of posterior reversal encephalopathy syndrome (PRES) cannot be excluded (particularly given the foci of cortical/subcortical T2 FLAIR hyperintense signal abnormality described above). 7. Fairly numerous supratentorial chronic microhemorrhages, which may reflect sequela of chronic hypertensive microangiopathy and/or cerebral amyloid angiopathy. 8. Paranasal sinus disease, as described. 9. Right mastoid effusion. Trace fluid also present within the left mastoid air cells.  Impression   Susan Owen is a 61 y.o. female admitted with chest pain who has been having several weeks of vision deficit. She has significantly decreased visual acuity in R eye along with tunneling of vision in left eye. This has been gradually progressive over the last year. She has no pain. MRI Brain with chronic infarcts and significant small vessel disease and difficult to rule out PRES given the T2/FLAIR changes in pons.  Her clinical history is not suggestive of PRES. It is possible that she did have PRES at some point but I do not see significant T2/FLAIR changes  in the occipital lobes that could explain this.  Given chronic infarcts, would recommend starting her on Aspirin 81mg  daily,  recommend blood pressure control and follow up with neurology.  I think she would benefit from Ophthalmology evaluation at some point. I would run it by ophthalmology team to see if this is something that needs to be addressed in patient or if they think this is safe to be evaluated outpatient.  Recommendations  - Follow up with neurology outpatient for noted chronic strokes. - Aspirin 81mg  daily. - Would benefit from ophthalmology evaluation. - She probably had PRES at some point and would benefit from long team BP control. Her current presentation and history is not consistent with PRES. - We will signoff. Please feel free to contact us with any questions or concerns.  ________________________________________________________   Thank you for the opportunity to take part in the care of this patient. If you have any further questions, please contact the neurology consultation attending.  Signed,  [place] Pager Number [phone] _ _ _   _ __   _ __ _ _  __ __   _ __   __ _

## 2021-12-07 NOTE — Progress Notes (Signed)
Informed by RN that patient complaining of visual loss that started some time this am (poor historian-dementia). On my evaluation-eating lunch-she seems to have visual loss (only see's shapes) in the right eye, left eye exam (finger count etc) appears to be stable. She is moving all 4 ext symmetrically. Tried calling daughter x 2 to see if this vision issue is a new issue-but getting VM. Have d/w Dr Yong Channel (Neuro)-MRI/MRA Brain ordered. Neuro to formally evaluate shortly.

## 2021-12-07 NOTE — Discharge Summary (Addendum)
PATIENT DETAILS Name: Susan Owen Age: 61 y.o. Sex: female Date of Birth: Nov 27, 1960 MRN: [id number]. Admitting Physician: Jonetta Osgood, MD PCP:Pcp, No  Admit Date: 12/05/2021 Discharge date: 12/08/2021  Recommendations for Outpatient Follow-up:  Follow up with PCP in 1-2 weeks Please obtain CMP/CBC in one week Please ensure follow-up with ophthalmology, nephrology  Admitted From:  Home  Disposition: Home   Discharge Condition: stable  CODE STATUS:   Code Status: Full Code   Diet recommendation:  Diet Order             Diet - low sodium heart healthy           Diet Carb Modified           Diet renal/carb modified with fluid restriction Diet-HS Snack? Nothing; Fluid restriction: 1200 mL Fluid; Room service appropriate? Yes; Fluid consistency: Thin  Diet effective now                    Brief Summary: Patient is a 61 y.o.  female with history of ESRD on HD, HTN, HLD-presented to the hospital for evaluation of chest pain.   Significant Hospital events: 2/1>> admit to [facility] for chest pain.  Significant imaging studies: 2/1>> CXR: No pneumonia 2/2>> Echo: EF 60-65% with no regional wall motion abnormalities  Significant microbiology data: 2/1>> COVID/flu PCR: Negative  Procedures: None  Consults:  Cardiology, nephrology, ophthalmology-Dr. Katy Fitch (phone consult), neurology  Brief Hospital Course: * Chest pain- (present on admission) Felt to be atypical-probably related to GERD-chest pain has resolved after starting PPI.  Echo with preserved EF.  Evaluated by cardiology-no further work-up planned.  Cardiology will arrange for outpatient follow-up.  Hypertensive urgency- (present on admission) BP on the higher side-fluctuating-increase Coreg to 25 mg twice daily, continue amlodipine.  Follow and optimize.    Vision loss of right eye Was informed by RN on 2/3 that patient was complaining of visual loss in her right eye.  Difficult situation-very poor  historian-unclear when this issue actually started-yesterday she said it was a few days ago, today she is indicating this has been going on for months (have called daughter multiple times yesterday/today without any success).  Appreciate neurology input-MRI brain/MRA head/neck without any acute issues.  Discussed with ophthalmology on call-Dr. Renato Shin reviewed chart-recommends follow-up with him in his office next week.  I think that is reasonable-as further inpatient ophthalmology exam is unlikely to change management.  Finally was able to get hold of the daughter on 12/4-she confirms that these issues have been ongoing for several years and have worsened recently.  She is aware that patient needs to follow-up with ophthalmology-contact information for Dr. Katy Fitch in the follow-up section.  Normocytic anemia Likely due to CKD-defer Aranesp/iron therapy to nephrology.  ESRD on dialysis [facility]) Missed HD on 2/1-nephrology following for HD care.  Due to transportation issues-it was difficult for patient to go to her dialysis center in [place]-nephrology navigator has switched dialysis to [place] closer to where she lives.  Type 2 diabetes mellitus (HCC) CBG stable-continue SSI.  A1c 5.3.  Likely managed with diet control in the outpatient setting.   Recent Labs    12/07/21 2110 12/08/21 0814 12/08/21 1135  GLUCAP 138* 92 159*    Hyperlipidemia Continue statin.  Hypothyroidism Resume Synthroid  Chronic indwelling Foley catheter Resume outpatient follow-up with urology.  Defer voiding trial to the outpatient setting as this is a chronic issue.  Dementia ([place])- (present on admission) Per daughter who I spoke  to over the phone on 2/2-patient has been diagnosed with early onset dementia.  Patient appears to be a terrible historian at baseline.   BMI: Estimated body mass index is 30.69 kg/m as calculated from the following:   Height as of this encounter: 5\' 4"  (1.626 m).   Weight  as of this encounter: 81.1 kg.    Discharge Diagnoses:  Principal Problem:   Chest pain Active Problems:   Hypertensive urgency   Vision loss of right eye   ESRD on dialysis ([place])   Normocytic anemia   Type 2 diabetes mellitus ([place])   Hyperlipidemia   Hypothyroidism   Chronic indwelling Foley catheter   Dementia [facility])   Discharge Instructions:  Activity:  As tolerated with Full fall precautions use walker/cane & assistance as needed   Discharge Instructions     Diet - low sodium heart healthy   Complete by: As directed    Diet Carb Modified   Complete by: As directed    Discharge instructions   Complete by: As directed    Follow with Primary MD   in 1-2 weeks  Please get a complete blood count and chemistry panel checked by your Primary MD at your next visit, and again as instructed by your Primary MD.  Get Medicines reviewed and adjusted: Please take all your medications with you for your next visit with your Primary MD  Laboratory/radiological data: Please request your Primary MD to go over all hospital tests and procedure/radiological results at the follow up, please ask your Primary MD to get all Hospital records sent to his/her office.  In some cases, they will be blood work, cultures and biopsy results pending at the time of your discharge. Please request that your primary care M.D. follows up on these results.  Also Note the following: If you experience worsening of your admission symptoms, develop shortness of breath, life threatening emergency, suicidal or homicidal thoughts you must seek medical attention immediately by calling 911 or calling your MD immediately  if symptoms less severe.  You must read complete instructions/literature along with all the possible adverse reactions/side effects for all the Medicines you take and that have been prescribed to you. Take any new Medicines after you have completely understood and accpet all the possible adverse  reactions/side effects.   Do not drive when taking Pain medications or sleeping medications (Benzodaizepines)  Do not take more than prescribed Pain, Sleep and Anxiety Medications. It is not advisable to combine anxiety,sleep and pain medications without talking with your primary care practitioner  Special Instructions: If you have smoked or chewed Tobacco  in the last 2 yrs please stop smoking, stop any regular Alcohol  and or any Recreational drug use.  Wear Seat belts while driving.  Please note: You were cared for by a hospitalist during your hospital stay. Once you are discharged, your primary care physician will handle any further medical issues. Please note that NO REFILLS for any discharge medications will be authorized once you are discharged, as it is imperative that you return to your primary care physician (or establish a relationship with a primary care physician if you do not have one) for your post hospital discharge needs so that they can reassess your need for medications and monitor your lab values.   Increase activity slowly   Complete by: As directed    No wound care   Complete by: As directed       Allergies as of 12/08/2021  Reactions   Coumadin [warfarin Sodium] Other (See Comments)   Avoid all oral anticoagulation medications related to cerebral amyloid angiopathy     Nasal Spray    Other reaction(s): Other Other reaction(s): Cutaneous reactions   Promethazine    Restless legs   Tolmetin         Medication List     STOP taking these medications    labetalol 100 MG tablet Commonly known as: NORMODYNE   torsemide 100 MG tablet Commonly known as: DEMADEX       TAKE these medications    amLODipine 10 MG tablet Commonly known as: NORVASC Take 1 tablet (10 mg total) by mouth daily.   aspirin 81 MG chewable tablet Chew 1 tablet (81 mg total) by mouth daily. What changed:  when to take this reasons to take this   atorvastatin 40 MG  tablet Commonly known as: LIPITOR Take 1 tablet (40 mg total) by mouth daily.   carvedilol 25 MG tablet Commonly known as: COREG Take 1 tablet (25 mg total) by mouth 2 (two) times daily with a meal.   FLUoxetine 20 MG capsule Commonly known as: PROZAC Take 1 capsule (20 mg total) by mouth daily.   hydrALAZINE 25 MG tablet Commonly known as: APRESOLINE Take 1 tablet (25 mg total) by mouth 2 (two) times daily.   levothyroxine 25 MCG tablet Commonly known as: SYNTHROID Take 1 tablet (25 mcg total) by mouth daily.   pantoprazole 40 MG tablet Commonly known as: PROTONIX Take 1 tablet (40 mg total) by mouth daily at 12 noon.         Follow-up Information     Cardiology. Call.   Why: Please call to schedule a follow-up appointment with your primary Cardiologist at [facility] within the next 2-3 weeks.        primary care MD. Schedule an appointment as soon as possible for a visit in 1 week(s).          Emilie Rutter, [facility] Follow up.   Why: Schedule is Monday,Wednesday,Friday Patient can start on Monday and will need to arrive at 10:15 for paperwork.  After first appointment, pt will need to arrive at 11:00 for 11:20 chair time. Contact information: [place] [address] [phone]         Debbra Riding, MD. Go on 12/10/2021.   Specialty: Ophthalmology Why: Please go to the office early next week for a eye exam Contact information: [place] [id number] [phone]                Allergies  Allergen Reactions   Coumadin [Warfarin Sodium] Other (See Comments)    Avoid all oral anticoagulation medications related to cerebral amyloid angiopathy       Nasal Spray     Other reaction(s): Other Other reaction(s): Cutaneous reactions   Promethazine     Restless legs   Tolmetin      Other Procedures/Studies: DG Chest 2 View  Result Date: 12/05/2021 CLINICAL DATA:  Chest pain, missed dialysis. EXAM:  CHEST - 2 VIEW COMPARISON:  None. FINDINGS: Right-sided central venous catheter tip projects over the distal SVC. The heart is enlarged. There is no pleural effusion or pneumothorax. No focal lung infiltrate or pulmonary edema. No acute fractures. IMPRESSION: 1. Cardiomegaly. 2. No acute cardiopulmonary process. Electronically Signed   By: Ronney Asters M.D.   On: 12/05/2021 19:49   MR ANGIO HEAD WO CONTRAST  Result Date: 12/07/2021  CLINICAL DATA:  Neuro deficit, acute, stroke suspected EXAM: MRA NECK WITHOUT CONTRAST MRA HEAD WITHOUT CONTRAST TECHNIQUE: Angiographic images of the Circle of Willis were acquired using MRA technique without intravenous contrast. COMPARISON:  None. FINDINGS: MRA NECK FINDINGS Motion artifact is present. Common, internal, and external carotid arteries are patent. Codominant extracranial vertebral arteries are patent. No apparent hemodynamically significant stenosis. MRA HEAD FINDINGS Motion artifact is present. Internal carotid arteries are patent. Proximal anterior and middle cerebral arteries are patent. Intracranial vertebral arteries and basilar artery are patent. Appears to be a right posterior communicating artery. Proximal right posterior cerebral artery is patent. Proximal left posterior cerebral artery is not well evaluated the on the P1 segment. IMPRESSION: Significant motion degradation. No large vessel occlusion or hemodynamically significant stenosis identified in the neck. No proximal intracranial vessel occlusion, noting the proximal left posterior cerebral artery is not well evaluated. Electronically Signed   By: Macy Mis M.D.   On: 12/07/2021 15:25   MR ANGIO NECK WO CONTRAST  Result Date: 12/07/2021 CLINICAL DATA:  Neuro deficit, acute, stroke suspected EXAM: MRA NECK WITHOUT CONTRAST MRA HEAD WITHOUT CONTRAST TECHNIQUE: Angiographic images of the Circle of Willis were acquired using MRA technique without intravenous contrast. COMPARISON:  None. FINDINGS:  MRA NECK FINDINGS Motion artifact is present. Common, internal, and external carotid arteries are patent. Codominant extracranial vertebral arteries are patent. No apparent hemodynamically significant stenosis. MRA HEAD FINDINGS Motion artifact is present. Internal carotid arteries are patent. Proximal anterior and middle cerebral arteries are patent. Intracranial vertebral arteries and basilar artery are patent. Appears to be a right posterior communicating artery. Proximal right posterior cerebral artery is patent. Proximal left posterior cerebral artery is not well evaluated the on the P1 segment. IMPRESSION: Significant motion degradation. No large vessel occlusion or hemodynamically significant stenosis identified in the neck. No proximal intracranial vessel occlusion, noting the proximal left posterior cerebral artery is not well evaluated. Electronically Signed   By: Macy Mis M.D.   On: 12/07/2021 15:25   MR BRAIN WO CONTRAST  Result Date: 12/07/2021 CLINICAL DATA:  Provided history: Neuro deficit, acute, stroke suspected. EXAM: MRI HEAD WITHOUT CONTRAST TECHNIQUE: Multiplanar, multiecho pulse sequences of the brain and surrounding structures were obtained without intravenous contrast. COMPARISON:  MRA head and MRA neck performed earlier today 12/07/2021. FINDINGS: Brain: Cerebral volume appears normal for age. Small focus of cortical/subcortical T2 FLAIR hyperintense signal abnormality within the mid to posterior right temporal lobe, which may reflect posttraumatic encephalomalacia or a chronic infarct (for instance as seen on series 5, image 10). Additional small foci of cortical/subcortical T2 FLAIR hyperintense signal abnormality within the right occipital lobe and right parietal lobe (for instance as seen on series 5, images 11, 18 and 19). These likely reflect chronic infarcts. Chronic small-vessel infarcts within the bilateral cerebral hemispheric white matter, left basal ganglia, left  thalamus and posterior limb of left internal capsule. Background moderate (but significantly age-advanced) multifocal T2 FLAIR hyperintense signal abnormality within the cerebral white matter, nonspecific but likely reflecting chronic small vessel ischemic disease given the patient's history of ESRD, hyperlipidemia, hypertension and type 2 diabetes mellitus. Extensive T2 FLAIR hyperintense signal abnormality within the pons. There are fairly numerous supratentorial chronic microhemorrhages, most notably within the right temporal lobe. There is no acute infarct. No evidence of an intracranial mass. No extra-axial fluid collection. No midline shift. Vascular: Maintained flow voids within the proximal large arterial vessels. Skull and upper cervical spine: No focal suspicious marrow lesion. Sinuses/Orbits: Visualized orbits  show no acute finding. Mild mucosal thickening within the bilateral ethmoid, sphenoid and maxillary sinuses. Superimposed small mucous retention cyst within the right maxillary sinus. Other: Right mastoid effusion. Trace fluid also present within the left mastoid air cells. Impression #6 will be called to the ordering clinician or representative by the Radiologist Assistant, and communication documented in the PACS or [employer]. IMPRESSION: 1. No evidence of acute infarction. 2. Small foci of cortical/subcortical T2 FLAIR hyperintense signal abnormality within the right occipital and parietal lobes, likely reflect chronic infarcts. 3. Small focus of cortical/subcortical T2 FLAIR hyperintense signal abnormality within the mid to posterior right temporal lobe. This may reflect posttraumatic encephalomalacia or a chronic infarct. 4. Chronic small-vessel infarcts within the bilateral cerebral hemispheric white matter, left basal ganglia, left thalamus and posterior limb of left internal capsule. 5. Background moderate (but significantly age-advanced) signal changes within the cerebral white  matter, nonspecific but likely reflecting chronic small vessel ischemic disease given the patient's history of ESRD, hyperlipidemia, hypertension and type 2 diabetes. 6. Prominent T2 FLAIR hyperintense signal changes within the pons, which may also reflect chronic small vessel ischemic disease. However, a component of posterior reversal encephalopathy syndrome (PRES) cannot be excluded (particularly given the foci of cortical/subcortical T2 FLAIR hyperintense signal abnormality described above). 7. Fairly numerous supratentorial chronic microhemorrhages, which may reflect sequela of chronic hypertensive microangiopathy and/or cerebral amyloid angiopathy. 8. Paranasal sinus disease, as described. 9. Right mastoid effusion. Trace fluid also present within the left mastoid air cells. Electronically Signed   By: Kellie Simmering D.O.   On: 12/07/2021 20:00   ECHOCARDIOGRAM COMPLETE  Result Date: 12/06/2021    ECHOCARDIOGRAM REPORT   Patient Name:   SHAKEMIA MADERA Date of Exam: 12/06/2021 Medical Rec #:  [id number]    Height:       64.0 in Accession #:    [id number]   Weight:       173.7 lb Date of Birth:  1961-06-28    BSA:          1.843 m Patient Age:    20 years     BP:           158/63 mmHg Patient Gender: F            HR:           72 bpm. Exam Location:  Inpatient Procedure: 2D Echo, Cardiac Doppler and Color Doppler Indications:    R07.9* Chest pain, unspecified  History:        Patient has no prior history of Echocardiogram examinations.  Sonographer:    Bernadene Person RDCS Referring Phys: [id number] [place]  1. Left ventricular ejection fraction, by estimation, is 60 to 65%. The left ventricle has normal function. The left ventricle has no regional wall motion abnormalities. Left ventricular diastolic parameters were normal.  2. Right ventricular systolic function is normal. The right ventricular size is normal. There is normal pulmonary artery systolic pressure.  3. Left atrial size was moderately  dilated.  4. A small pericardial effusion is present. The pericardial effusion is circumferential.  5. The mitral valve is normal in structure. Mild mitral valve regurgitation.  6. The aortic valve is normal in structure. Aortic valve regurgitation is not visualized. FINDINGS  Left Ventricle: Left ventricular ejection fraction, by estimation, is 60 to 65%. The left ventricle has normal function. The left ventricle has no regional wall motion abnormalities. The left ventricular internal cavity size was normal in size. There is  no  left ventricular hypertrophy. Left ventricular diastolic parameters were normal. Right Ventricle: The right ventricular size is normal. Right vetricular wall thickness was not assessed. Right ventricular systolic function is normal. There is normal pulmonary artery systolic pressure. The tricuspid regurgitant velocity is 1.73 m/s, and with an assumed right atrial pressure of 3 mmHg, the estimated right ventricular systolic pressure is 27.0 mmHg. Left Atrium: Left atrial size was moderately dilated. Right Atrium: Right atrial size was normal in size. Pericardium: A small pericardial effusion is present. The pericardial effusion is circumferential. Mitral Valve: The mitral valve is normal in structure. Mild mitral valve regurgitation. Tricuspid Valve: The tricuspid valve is normal in structure. Tricuspid valve regurgitation is mild. Aortic Valve: The aortic valve is normal in structure. Aortic valve regurgitation is not visualized. Pulmonic Valve: The pulmonic valve was normal in structure. Pulmonic valve regurgitation is mild. Aorta: The aortic root and ascending aorta are structurally normal, with no evidence of dilitation. IAS/Shunts: No atrial level shunt detected by color flow Doppler.  LEFT VENTRICLE PLAX 2D LVIDd:         4.90 cm     Diastology LVIDs:         3.30 cm     LV e' medial:    3.75 cm/s LV PW:         1.10 cm     LV E/e' medial:  35.5 LV IVS:        1.10 cm     LV e'  lateral:   4.22 cm/s LVOT diam:     1.80 cm     LV E/e' lateral: 31.5 LV SV:         67 LV SV Index:   36 LVOT Area:     2.54 cm  LV Volumes (MOD) LV vol d, MOD A2C: 83.1 ml LV vol d, MOD A4C: 96.6 ml LV vol s, MOD A2C: 38.8 ml LV vol s, MOD A4C: 41.2 ml LV SV MOD A2C:     44.3 ml LV SV MOD A4C:     96.6 ml LV SV MOD BP:      50.4 ml RIGHT VENTRICLE RV S prime:     9.14 cm/s TAPSE (M-mode): 1.6 cm LEFT ATRIUM             Index        RIGHT ATRIUM           Index LA diam:        3.70 cm 2.01 cm/m   RA Area:     15.10 cm LA Vol (A2C):   74.7 ml 40.54 ml/m  RA Volume:   38.90 ml  21.11 ml/m LA Vol (A4C):   86.1 ml 46.73 ml/m LA Biplane Vol: 80.6 ml 43.74 ml/m  AORTIC VALVE LVOT Vmax:   118.00 cm/s LVOT Vmean:  86.500 cm/s LVOT VTI:    0.262 m  AORTA Ao Root diam: 3.30 cm Ao Asc diam:  3.60 cm MITRAL VALVE                TRICUSPID VALVE MV Area (PHT): 4.29 cm     TR Peak grad:   12.0 mmHg MV Decel Time: 177 msec     TR Vmax:        173.00 cm/s MV E velocity: 133.00 cm/s MV A velocity: 107.00 cm/s  SHUNTS MV E/A ratio:  1.24         Systemic VTI:  0.26 m  Systemic Diam: 1.80 cm Dorris Carnes MD Electronically signed by Dorris Carnes MD Signature Date/Time: 12/06/2021/11:39:15 AM    Final      TODAY-DAY OF DISCHARGE:  Subjective:   Susan Owen today has no headache,no chest abdominal pain,no new weakness tingling or numbness, feels much better wants to go home today.  Objective:   Blood pressure (!) 158/68, pulse 66, temperature 98.4 F (36.9 C), temperature source Oral, resp. rate 16, height 5\' 4"  (1.626 m), weight 81.1 kg, SpO2 98 %.  Intake/Output Summary (Last 24 hours) at 12/08/2021 1257 Last data filed at 12/08/2021 1030 Gross per 24 hour  Intake 220 ml  Output 3527 ml  Net -3307 ml   Filed Weights   12/06/21 0437 12/07/21 1402 12/07/21 1405  Weight: 78.8 kg 81.1 kg 81.1 kg    Exam: Awake Alert, Oriented *3, No new F.N deficits, Normal affect [place].AT,PERRAL Supple  Neck,No JVD, No cervical lymphadenopathy appriciated.  Symmetrical Chest wall movement, Good air movement bilaterally, CTAB RRR,No Gallops,Rubs or new Murmurs, No Parasternal Heave +ve B.Sounds, Abd Soft, Non tender, No organomegaly appriciated, No rebound -guarding or rigidity. No Cyanosis, Clubbing or edema, No new Rash or bruise   PERTINENT RADIOLOGIC STUDIES: MR ANGIO HEAD WO CONTRAST  Result Date: 12/07/2021 CLINICAL DATA:  Neuro deficit, acute, stroke suspected EXAM: MRA NECK WITHOUT CONTRAST MRA HEAD WITHOUT CONTRAST TECHNIQUE: Angiographic images of the Circle of Willis were acquired using MRA technique without intravenous contrast. COMPARISON:  None. FINDINGS: MRA NECK FINDINGS Motion artifact is present. Common, internal, and external carotid arteries are patent. Codominant extracranial vertebral arteries are patent. No apparent hemodynamically significant stenosis. MRA HEAD FINDINGS Motion artifact is present. Internal carotid arteries are patent. Proximal anterior and middle cerebral arteries are patent. Intracranial vertebral arteries and basilar artery are patent. Appears to be a right posterior communicating artery. Proximal right posterior cerebral artery is patent. Proximal left posterior cerebral artery is not well evaluated the on the P1 segment. IMPRESSION: Significant motion degradation. No large vessel occlusion or hemodynamically significant stenosis identified in the neck. No proximal intracranial vessel occlusion, noting the proximal left posterior cerebral artery is not well evaluated. Electronically Signed   By: Macy Mis M.D.   On: 12/07/2021 15:25   MR ANGIO NECK WO CONTRAST  Result Date: 12/07/2021 CLINICAL DATA:  Neuro deficit, acute, stroke suspected EXAM: MRA NECK WITHOUT CONTRAST MRA HEAD WITHOUT CONTRAST TECHNIQUE: Angiographic images of the Circle of Willis were acquired using MRA technique without intravenous contrast. COMPARISON:  None. FINDINGS: MRA NECK  FINDINGS Motion artifact is present. Common, internal, and external carotid arteries are patent. Codominant extracranial vertebral arteries are patent. No apparent hemodynamically significant stenosis. MRA HEAD FINDINGS Motion artifact is present. Internal carotid arteries are patent. Proximal anterior and middle cerebral arteries are patent. Intracranial vertebral arteries and basilar artery are patent. Appears to be a right posterior communicating artery. Proximal right posterior cerebral artery is patent. Proximal left posterior cerebral artery is not well evaluated the on the P1 segment. IMPRESSION: Significant motion degradation. No large vessel occlusion or hemodynamically significant stenosis identified in the neck. No proximal intracranial vessel occlusion, noting the proximal left posterior cerebral artery is not well evaluated. Electronically Signed   By: Macy Mis M.D.   On: 12/07/2021 15:25   MR BRAIN WO CONTRAST  Result Date: 12/07/2021 CLINICAL DATA:  Provided history: Neuro deficit, acute, stroke suspected. EXAM: MRI HEAD WITHOUT CONTRAST TECHNIQUE: Multiplanar, multiecho pulse sequences of the brain and surrounding structures were obtained without  intravenous contrast. COMPARISON:  MRA head and MRA neck performed earlier today 12/07/2021. FINDINGS: Brain: Cerebral volume appears normal for age. Small focus of cortical/subcortical T2 FLAIR hyperintense signal abnormality within the mid to posterior right temporal lobe, which may reflect posttraumatic encephalomalacia or a chronic infarct (for instance as seen on series 5, image 10). Additional small foci of cortical/subcortical T2 FLAIR hyperintense signal abnormality within the right occipital lobe and right parietal lobe (for instance as seen on series 5, images 11, 18 and 19). These likely reflect chronic infarcts. 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 Background moderate (but significantly age-advanced) multifocal T2 FLAIR hyperintense signal abnormality within the cerebral white matter, nonspecific but likely reflecting chronic small vessel ischemic disease given the patient's history of ESRD, hyperlipidemia, hypertension and type 2 diabetes mellitus. Extensive T2 FLAIR hyperintense signal abnormality within the pons. There are fairly numerous supratentorial chronic microhemorrhages, most notably within the right temporal lobe. There is no acute infarct. No evidence of an intracranial mass. No extra-axial fluid collection. No midline shift. Vascular: Maintained flow voids within the proximal large arterial vessels. Skull and upper cervical spine: No focal suspicious marrow lesion. Sinuses/Orbits: Visualized orbits show no acute finding. Mild mucosal thickening within the bilateral ethmoid, sphenoid and maxillary sinuses. Superimposed small mucous retention cyst within the right maxillary sinus. Other: Right mastoid effusion. Trace fluid also present within the left mastoid air cells. Impression #6 will be called to the ordering clinician or representative by the Radiologist Assistant, and communication documented in the PACS or [employer]. IMPRESSION: 1. No evidence of acute infarction. 2. Small foci of cortical/subcortical T2 FLAIR hyperintense signal abnormality within the right occipital and parietal lobes, likely reflect chronic infarcts. 3. Small focus of cortical/subcortical T2 FLAIR hyperintense signal abnormality within the mid to posterior right temporal lobe. This may reflect posttraumatic encephalomalacia or a chronic infarct. 4. Chronic small-vessel infarcts within the bilateral cerebral hemispheric white matter, left basal ganglia, left thalamus and posterior limb of left internal capsule. 5. Background moderate (but significantly age-advanced) signal changes within the cerebral white matter,  nonspecific but likely reflecting chronic small vessel ischemic disease given the patient's history of ESRD, hyperlipidemia, hypertension and type 2 diabetes. 6. Prominent T2 FLAIR hyperintense signal changes within the pons, which may also reflect chronic small vessel ischemic disease. However, a component of posterior reversal encephalopathy syndrome (PRES) cannot be excluded (particularly given the foci of cortical/subcortical T2 FLAIR hyperintense signal abnormality described above). 7. Fairly numerous supratentorial chronic microhemorrhages, which may reflect sequela of chronic hypertensive microangiopathy and/or cerebral amyloid angiopathy. 8. Paranasal sinus disease, as described. 9. Right mastoid effusion. Trace fluid also present within the left mastoid air cells. Electronically Signed   By: Kellie Simmering D.O.   On: 12/07/2021 20:00     PERTINENT LAB RESULTS: CBC: Recent Labs    12/06/21 0417 12/07/21 0256  WBC 5.1 5.5  HGB 9.3* 9.4*  HCT 29.4* 29.7*  PLT 202 199   CMET CMP     Component Value Date/Time   NA 137 12/07/2021 0256   K 3.9 12/07/2021 0256   CL 107 12/07/2021 0256   CO2 24 12/07/2021 0256   GLUCOSE 111 (H) 12/07/2021 0256   BUN 52 (H) 12/07/2021 0256   CREATININE 4.52 (H) 12/07/2021 0256   CALCIUM 9.1 12/07/2021 0256   ALBUMIN 2.6 (L) 12/07/2021 0256   GFRNONAA 11 (L) 12/07/2021 0256  GFR Estimated Creatinine Clearance: 14 mL/min (A) (by C-G formula based on SCr of 4.52 mg/dL (H)). No results for input(s): LIPASE, AMYLASE in the last 72 hours. No results for input(s): CKTOTAL, CKMB, CKMBINDEX, TROPONINI in the last 72 hours. Invalid input(s): POCBNP No results for input(s): DDIMER in the last 72 hours. Recent Labs    12/06/21 0003  HGBA1C 5.3   No results for input(s): CHOL, HDL, LDLCALC, TRIG, CHOLHDL, LDLDIRECT in the last 72 hours. No results for input(s): TSH, T4TOTAL, T3FREE, THYROIDAB in the last 72 hours.  Invalid input(s): FREET3 No results  for input(s): VITAMINB12, FOLATE, FERRITIN, TIBC, IRON, RETICCTPCT in the last 72 hours. Coags: No results for input(s): INR in the last 72 hours.  Invalid input(s): PT Microbiology: Recent Results (from the past 240 hour(s))  Resp Panel by RT-PCR (Flu A&B, Covid) Nasopharyngeal Swab     Status: None   Collection Time: 12/05/21 11:06 PM   Specimen: Nasopharyngeal Swab; Nasopharyngeal(NP) swabs in vial transport medium  Result Value Ref Range Status   SARS Coronavirus 2 by RT PCR NEGATIVE NEGATIVE Final    Comment: (NOTE) SARS-CoV-2 target nucleic acids are NOT DETECTED.  The SARS-CoV-2 RNA is generally detectable in upper respiratory specimens during the acute phase of infection. The lowest concentration of SARS-CoV-2 viral copies this assay can detect is 138 copies/mL. A negative result does not preclude SARS-Cov-2 infection and should not be used as the sole basis for treatment or other patient management decisions. A negative result may occur with  improper specimen collection/handling, submission of specimen other than nasopharyngeal swab, presence of viral mutation(s) within the areas targeted by this assay, and inadequate number of viral copies(<138 copies/mL). A negative result must be combined with clinical observations, patient history, and epidemiological information. The expected result is Negative.  Fact Sheet for Patients:  EntrepreneurPulse.com.au  Fact Sheet for Healthcare Providers:  IncredibleEmployment.be  This test is no t yet approved or cleared by the [place] FDA and  has been authorized for detection and/or diagnosis of SARS-CoV-2 by FDA under an Emergency Use Authorization (EUA). This EUA will remain  in effect (meaning this test can be used) for the duration of the COVID-19 declaration under Section 564(b)(1) of the Act, 21 U.S.C.section 360bbb-3(b)(1), unless the authorization is terminated  or revoked sooner.        Influenza A by PCR NEGATIVE NEGATIVE Final   Influenza B by PCR NEGATIVE NEGATIVE Final    Comment: (NOTE) The Xpert Xpress SARS-CoV-2/FLU/RSV plus assay is intended as an aid in the diagnosis of influenza from Nasopharyngeal swab specimens and should not be used as a sole basis for treatment. Nasal washings and aspirates are unacceptable for Xpert Xpress SARS-CoV-2/FLU/RSV testing.  Fact Sheet for Patients: EntrepreneurPulse.com.au  Fact Sheet for Healthcare Providers: IncredibleEmployment.be  This test is not yet approved or cleared by the [place] FDA and has been authorized for detection and/or diagnosis of SARS-CoV-2 by FDA under an Emergency Use Authorization (EUA). This EUA will remain in effect (meaning this test can be used) for the duration of the COVID-19 declaration under Section 564(b)(1) of the Act, 21 U.S.C. section 360bbb-3(b)(1), unless the authorization is terminated or revoked.  Performed at [facility], [place] [address], [place], [place] [address]     FURTHER DISCHARGE INSTRUCTIONS:  Get Medicines reviewed and adjusted: Please take all your medications with you for your next visit with your Primary MD  Laboratory/radiological data: Please request your Primary MD to go over  all hospital tests and procedure/radiological results at the follow up, please ask your Primary MD to get all Hospital records sent to his/her office.  In some cases, they will be blood work, cultures and biopsy results pending at the time of your discharge. Please request that your primary care M.D. goes through all the records of your hospital data and follows up on these results.  Also Note the following: If you experience worsening of your admission symptoms, develop shortness of breath, life threatening emergency, suicidal or homicidal thoughts you must seek medical attention immediately by calling 911 or calling your MD  immediately  if symptoms less severe.  You must read complete instructions/literature along with all the possible adverse reactions/side effects for all the Medicines you take and that have been prescribed to you. Take any new Medicines after you have completely understood and accpet all the possible adverse reactions/side effects.   Do not drive when taking Pain medications or sleeping medications (Benzodaizepines)  Do not take more than prescribed Pain, Sleep and Anxiety Medications. It is not advisable to combine anxiety,sleep and pain medications without talking with your primary care practitioner  Special Instructions: If you have smoked or chewed Tobacco  in the last 2 yrs please stop smoking, stop any regular Alcohol  and or any Recreational drug use.  Wear Seat belts while driving.  Please note: You were cared for by a hospitalist during your hospital stay. Once you are discharged, your primary care physician will handle any further medical issues. Please note that NO REFILLS for any discharge medications will be authorized once you are discharged, as it is imperative that you return to your primary care physician (or establish a relationship with a primary care physician if you do not have one) for your post hospital discharge needs so that they can reassess your need for medications and monitor your lab values.  Total Time spent coordinating discharge including counseling, education and face to face time equals greater than 30 minutes.  SignedOren Binet 12/08/2021 12:57 PM

## 2021-12-07 NOTE — Progress Notes (Signed)
Patient ID: Susan Owen, female   DOB: [birth year], 61 y.o.   MRN: [id number] [facility] Progress Note   Assessment/ Plan:   1.  Chest pain: Atypical and possibly pleuritic with associated chest wall tenderness on palpation.  2D echocardiogram yesterday showed robust ejection fraction with a small pericardial effusion without tamponade physiology. 2.  End-stage renal disease: She is on schedule for hemodialysis today and will then again get hemodialysis tomorrow to return back to her usual outpatient TTS schedule.  The process has been started to try and get her transferred to a local unit here in [place] after she was rendered without a house in [place] and her daughter is having increasing difficulties transporting her to [facility] for dialysis 3 days a week. 3.  Hypertension: Blood pressures elevated, resumed back on amlodipine, hydralazine and labetalol and will monitor with hemodialysis/ultrafiltration today. 4.  Anemia of chronic disease: Marginally depressed hemoglobin and hematocrit,  I will order for Aranesp tomorrow with dialysis. 5.  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  Subjective:   Denies any acute events overnight, informs me that chest pain is better   Objective:   BP (!) 166/76 (BP Location: Right Arm)    Pulse 73    Temp 98.1 F (36.7 C) (Oral)    Resp 12    Ht 5\' 4"  (1.626 m)    Wt 78.8 kg    SpO2 98%    BMI 29.82 kg/m   Physical Exam: Gen: Comfortably resting in bed, awakens easily to conversation CVS: Pulse regular rhythm, normal rate, S1 and S2 normal Resp: Clear to auscultation bilaterally, no distinct rales or rhonchi.  Right IJ TDC Abd: Soft, obese, nontender, bowel sounds normal Ext: 1+ bilateral lower extremity edema  Labs: BMET Recent Labs  Lab 12/05/21 1653 12/06/21 0417 12/07/21 0256  NA 141 140 137  K 3.7 3.6 3.9  CL 105 105 107  CO2 26 25 24    GLUCOSE 138* 158* 111*  BUN 32* 40* 52*  CREATININE 3.91* 3.91* 4.52*  CALCIUM 9.2 8.8* 9.1  PHOS  --  3.8 3.5   CBC Recent Labs  Lab 12/05/21 1653 12/06/21 0417 12/07/21 0256  WBC 4.9 5.1 5.5  NEUTROABS 3.2  --   --   HGB 10.4* 9.3* 9.4*  HCT 32.8* 29.4* 29.7*  MCV 97.9 98.0 96.4  PLT 232 202 199      Medications:     amLODipine  10 mg Oral Daily   atorvastatin  40 mg Oral Daily   carvedilol  12.5 mg Oral BID WC   Chlorhexidine Gluconate Cloth  6 each Topical Daily   FLUoxetine  20 mg Oral Daily   insulin aspart  0-5 Units Subcutaneous QHS   insulin aspart  0-6 Units Subcutaneous TID WC   levothyroxine  25 mcg Oral Daily   pantoprazole  40 mg Oral Q1200   Elmarie Shiley, MD 12/07/2021, 9:50 AM

## 2021-12-07 NOTE — Progress Notes (Signed)
Pt BP was elevated upon arriving back to unit from MRI around 2020. SBP in 170s-180s. Pt has order for PRN IV hydralazine for SBP>170 but lost IV access prior to MRI. I assess the pts arms and attempted to reestablish access but had to put in an order for IV team. IV Hydralazine pushed soon after IV team established IV access. Med given at 2200. Will continue to monitor pt.

## 2021-12-07 NOTE — Progress Notes (Addendum)
Contacted [place] clinic this am. Spoke to [place], [place], regarding need for transfer packet to be faxed to [facility] admissions as soon as possible. SW to fax packet this am. Fax number provided. Spoke to [place] at [facility] admissions to make her aware that transfer packet will be faxed this am by pt's clinic. Will follow and assist.  Melven Sartorius Renal Navigator [phone]   Addendum at 11:27 am: Pt has been accepted at [facility] on a MWF schedule. Pt can start on Monday and needs to arrive at 10:15 for paperwork. Thereafter, pt will need to arrive at 11:00 for 11:20 chair time. Spoke to Manuel[place] at clinic who is aware that pt will d/c today and start on Monday. Berenice Primas in admissions aware of acceptance at [facility] and d/c today. Spoke to pt's dtr via phone. Dtr advised of clinic name, address, and schedule. Dtr agreeable to plan. Dtr aware pt will need to start at clinic on Monday and will need to arrive at 10:15 for paperwork (left a message and text with this info). Arrangements added to pt's AVS as well. Update provided to attending and nephrology staff. Renal PA aware of clinics need for orders. Spoke to [place], SW at [employer] [facility], regarding pt's plans at d/c. Elmyra Ricks has faxed transfer packet to [facility] admissions this am.

## 2021-12-07 NOTE — Progress Notes (Signed)
Pt c/o new onset vision loss. She states that she can only see shapes now. Vision improving in the left eye after eating but her right eye remains blurry. VS within Pt's limits with her systolic pressures being less than 170 throughout this shift and the previous one. MD aware, MRI ordered.

## 2021-12-08 ENCOUNTER — Encounter (HOSPITAL_COMMUNITY): Payer: Self-pay | Admitting: Internal Medicine

## 2021-12-08 DIAGNOSIS — H5461 Unqualified visual loss, right eye, normal vision left eye: Secondary | ICD-10-CM

## 2021-12-08 LAB — GLUCOSE, CAPILLARY
Glucose-Capillary: 92 mg/dL (ref 70–99)
Glucose-Capillary: 159 mg/dL — ABNORMAL HIGH (ref 70–99)

## 2021-12-08 LAB — HEPATITIS B SURFACE ANTIBODY, QUANTITATIVE: Hep B S AB Quant (Post): 17.1 m[IU]/mL (ref >9.9)

## 2021-12-08 MED ORDER — ASPIRIN 81 MG PO CHEW: 81.0000 mg | CHEWABLE_TABLET | Freq: Every day | ORAL | 2 refills | Status: AC

## 2021-12-08 MED ORDER — AMLODIPINE BESYLATE 10 MG PO TABS: 10.0000 mg | ORAL_TABLET | Freq: Every day | ORAL | 2 refills | Status: AC

## 2021-12-08 MED ORDER — CARVEDILOL 25 MG PO TABS: 25.0000 mg | ORAL_TABLET | Freq: Two times a day (BID) | ORAL | Status: DC

## 2021-12-08 MED ORDER — LEVOTHYROXINE SODIUM 25 MCG PO TABS: 25.0000 ug | ORAL_TABLET | Freq: Every day | ORAL | 3 refills | Status: AC

## 2021-12-08 MED ORDER — CARVEDILOL 25 MG PO TABS: 25.0000 mg | ORAL_TABLET | Freq: Two times a day (BID) | ORAL | 3 refills | Status: AC

## 2021-12-08 MED ORDER — PANTOPRAZOLE SODIUM 40 MG PO TBEC: 40.0000 mg | DELAYED_RELEASE_TABLET | Freq: Every day | ORAL | 3 refills | Status: AC

## 2021-12-08 MED ORDER — FLUOXETINE HCL 20 MG PO CAPS: 20.0000 mg | ORAL_CAPSULE | Freq: Every day | ORAL | 3 refills | Status: AC

## 2021-12-08 MED ORDER — HYDRALAZINE HCL 25 MG PO TABS: 25.0000 mg | ORAL_TABLET | Freq: Two times a day (BID) | ORAL | 3 refills | Status: AC

## 2021-12-08 NOTE — Assessment & Plan Note (Addendum)
Was informed by RN on 2/3 that patient was complaining of visual loss in her right eye.  Difficult situation-very poor historian-unclear when this issue actually started-yesterday she said it was a few days ago, today she is indicating this has been going on for months (have called daughter multiple times yesterday/today without any success).  Appreciate neurology input-MRI brain/MRA head/neck without any acute issues.  Discussed with ophthalmology on call-Dr. Renato Shin reviewed chart-recommends follow-up with him in his office next week.  I think that is reasonable-as further inpatient ophthalmology exam is unlikely to change management.  Finally was able to get hold of the daughter on 12/4-she confirms that these issues have been ongoing for several years and have worsened recently.  She is aware that patient needs to follow-up with ophthalmology-contact information for Dr. Katy Fitch in the follow-up section.

## 2021-12-08 NOTE — Progress Notes (Signed)
Patient's daughter not able to be reached by provider. This social worker able to contact daughter, she will arrive to pick up patient for discharge in approximately 45 minutes. Patient's daughter to present to bedside before discharge as she has questions about patient's dialysis treatments.   Cato Liburd, LCSW Transition of Care

## 2021-12-08 NOTE — Progress Notes (Signed)
Patient ID: Susan Owen, female   DOB: 11/04/1961, 61 y.o.   MRN: [id number] [facility] Progress Note   Assessment/ Plan:   1.  Chest pain: Atypical and possibly pleuritic with associated chest wall tenderness on palpation.  2D echocardiogram yesterday showed robust ejection fraction with a small pericardial effusion without tamponade physiology.  Chest pain is now resolved. 2.  End-stage renal disease: Her previous outpatient hemodialysis was on a TTS schedule and she has been accepted to [facility] dialysis unit on a Monday/Wednesday/Friday schedule second shift.  She underwent hemodialysis today and will get her next hemodialysis treatment on Monday 2/6. 3.  Hypertension: Blood pressures elevated,  on amlodipine 10 mg daily, carvedilol 25 mg twice daily (starting today) and as needed hydralazine.  Will monitor to see if she needs scheduled oral hydralazine. 4.  Anemia of chronic disease: Marginally depressed hemoglobin and hematocrit, on ESA. 5.  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  Subjective:   She informs me that she has not been able to see out of her right eye "for a while" with intermittent visual difficulties with her left eye.   Objective:   BP (!) 174/73 (BP Location: Right Arm)    Pulse 66    Temp 98.1 F (36.7 C) (Oral)    Resp 17    Ht 5\' 4"  (1.626 m)    Wt 81.1 kg    SpO2 99%    BMI 30.69 kg/m   Physical Exam: Gen: Comfortably resting in bed, awakens easily to conversation CVS: Pulse regular rhythm, normal rate, S1 and S2 normal Resp: Clear to auscultation bilaterally, no distinct rales or rhonchi.  Right IJ TDC Abd: Soft, obese, nontender, bowel sounds normal, chronic indwelling Foley catheter Ext: Chronic nonpitting bilateral lower extremity edema  Labs: BMET Recent Labs  Lab 12/05/21 1653 12/06/21 0417 12/07/21 0256  NA 141 140 137  K 3.7 3.6  3.9  CL 105 105 107  CO2 26 25 24   GLUCOSE 138* 158* 111*  BUN 32* 40* 52*  CREATININE 3.91* 3.91* 4.52*  CALCIUM 9.2 8.8* 9.1  PHOS  --  3.8 3.5   CBC Recent Labs  Lab 12/05/21 1653 12/06/21 0417 12/07/21 0256  WBC 4.9 5.1 5.5  NEUTROABS 3.2  --   --   HGB 10.4* 9.3* 9.4*  HCT 32.8* 29.4* 29.7*  MCV 97.9 98.0 96.4  PLT 232 202 199      Medications:     amLODipine  10 mg Oral Daily   atorvastatin  40 mg Oral Daily   carvedilol  25 mg Oral BID WC   Chlorhexidine Gluconate Cloth  6 each Topical Daily   FLUoxetine  20 mg Oral Daily   insulin aspart  0-5 Units Subcutaneous QHS   insulin aspart  0-6 Units Subcutaneous TID WC   levothyroxine  25 mcg Oral Daily   pantoprazole  40 mg Oral Q1200   Elmarie Shiley, MD 12/08/2021, 9:54 AM

## 2021-12-08 NOTE — Plan of Care (Signed)

## 2021-12-08 NOTE — Progress Notes (Signed)
PROGRESS NOTE        PATIENT DETAILS Name: Susan Owen Age: 61 y.o. Sex: female Date of Birth: [birth year] Admit Date: 12/05/2021 Admitting Physician Evalee Mutton Kristeen Mans, MD PCP:Pcp, No  Brief Summary: Patient is a 61 y.o.  female with history of ESRD on HD, HTN, HLD-presented to the hospital for evaluation of chest pain.   Significant Hospital events: 2/1>> admit to [facility] for chest pain.  Significant imaging studies: 2/1>> CXR: No pneumonia 2/2>> Echo: EF 60-65% with no regional wall motion abnormalities  Significant microbiology data: 2/1>> COVID/flu PCR: Negative  Procedures: None  Consults:  Cardiology, nephrology, ophthalmology-Dr. Katy Fitch (phone consult), neurology  Subjective: Lying comfortably in bed-no chest pain.  Today claims that her visual issues in her right eye has been going on for several months.  Objective: Vitals: Blood pressure (!) 174/73, pulse 66, temperature 98.1 F (36.7 C), temperature source Oral, resp. rate 17, height 5\' 4"  (1.626 m), weight 81.1 kg, SpO2 99 %.   Exam: Gen Exam:Alert awake-not in any distress HEENT:atraumatic, normocephalic Chest: B/L clear to auscultation anteriorly CVS:S1S2 regular Abdomen:soft non tender, non distended Extremities:no edema Neurology: Non focal Skin: no rash   Pertinent Labs/Radiology: CBC Latest Ref Rng & Units 12/07/2021 12/06/2021 12/05/2021  WBC 4.0 - 10.5 K/uL 5.5 5.1 4.9  Hemoglobin 12.0 - 15.0 g/dL 9.4(L) 9.3(L) 10.4(L)  Hematocrit 36.0 - 46.0 % 29.7(L) 29.4(L) 32.8(L)  Platelets 150 - 400 K/uL 199 202 232    Lab Results  Component Value Date   NA 137 12/07/2021   K 3.9 12/07/2021   CL 107 12/07/2021   CO2 24 12/07/2021      Assessment/Plan: * Chest pain- (present on admission) Felt to be atypical-probably related to GERD-chest pain has resolved after starting PPI.  Echo with preserved EF.  Evaluated by cardiology-no further work-up planned.  Cardiology will arrange for  outpatient follow-up.  Hypertensive urgency- (present on admission) BP on the higher side-fluctuating-increase Coreg to 25 mg twice daily, continue amlodipine.  Follow and optimize.    Vision loss of right eye Difficult situation-very poor historian-unclear when this issue actually started-yesterday she said it was a few days ago, today she is indicating this has been going on for months (have called daughter multiple times yesterday/today without any success).  Appreciate neurology input-MRI brain/MRA head/neck without any acute issues.  Discussed with ophthalmology on call-Dr. Renato Shin reviewed chart-recommends follow-up with him in his office next week.  I think that is reasonable-as further inpatient ophthalmology exam is unlikely to change management.  Normocytic anemia Likely due to CKD-defer Aranesp/iron therapy to nephrology.  ESRD on dialysis [facility]) Missed HD on 2/1-nephrology following for HD care.  Due to transportation issues-it was difficult for patient to go to her dialysis center in [place]-nephrology navigator has switched dialysis to [facility] closer to where she lives.  Type 2 diabetes mellitus (HCC) CBG stable-continue SSI.  A1c 5.3.  Likely managed with diet control in the outpatient setting.   Recent Labs    12/07/21 2110 12/08/21 0814 12/08/21 1135  GLUCAP 138* 92 159*    Hyperlipidemia Continue statin.  Hypothyroidism Resume Synthroid  Chronic indwelling Foley catheter Resume outpatient follow-up with urology.  Defer voiding trial to the outpatient setting as this is a chronic issue.  Dementia ([place])- (present on admission) Per daughter who I spoke to over the phone on 2/2-patient has  been diagnosed with early onset dementia.  Patient appears to be a terrible historian at baseline.   BMI: Estimated body mass index is 30.69 kg/m as calculated from the following:   Height as of this encounter: 5\' 4"  (1.626 m).   Weight as of this encounter: 81.1  kg.   Code status:   Code Status: Full Code   DVT Prophylaxis: SCDs Start: 12/06/21 0003    Procedures: None Consults: Cardiology, nephrology Family Communication: Daughter Kristen-[phone]-called x3-left voicemail.   Disposition Plan: Status is: Inpatient status  Medically stable for discharge-unable to get hold of family.   Diet: Diet Order             Diet - low sodium heart healthy           Diet Carb Modified           Diet renal/carb modified with fluid restriction Diet-HS Snack? Nothing; Fluid restriction: 1200 mL Fluid; Room service appropriate? Yes; Fluid consistency: Thin  Diet effective now                     Antimicrobial agents: Anti-infectives (From admission, onward)    None        MEDICATIONS: Scheduled Meds:  amLODipine  10 mg Oral Daily   atorvastatin  40 mg Oral Daily   carvedilol  25 mg Oral BID WC   Chlorhexidine Gluconate Cloth  6 each Topical Daily   FLUoxetine  20 mg Oral Daily   insulin aspart  0-5 Units Subcutaneous QHS   insulin aspart  0-6 Units Subcutaneous TID WC   levothyroxine  25 mcg Oral Daily   pantoprazole  40 mg Oral Q1200   Continuous Infusions: PRN Meds:.acetaminophen **OR** acetaminophen, alum & mag hydroxide-simeth, hydrALAZINE   I have personally reviewed following labs and imaging studies  LABORATORY DATA: CBC: Recent Labs  Lab 12/05/21 1653 12/06/21 0417 12/07/21 0256  WBC 4.9 5.1 5.5  NEUTROABS 3.2  --   --   HGB 10.4* 9.3* 9.4*  HCT 32.8* 29.4* 29.7*  MCV 97.9 98.0 96.4  PLT 232 202 161    Basic Metabolic Panel: Recent Labs  Lab 12/05/21 1653 12/06/21 0417 12/07/21 0256  NA 141 140 137  K 3.7 3.6 3.9  CL 105 105 107  CO2 26 25 24   GLUCOSE 138* 158* 111*  BUN 32* 40* 52*  CREATININE 3.91* 3.91* 4.52*  CALCIUM 9.2 8.8* 9.1  MG 2.2  --   --   PHOS  --  3.8 3.5    GFR: Estimated Creatinine Clearance: 14 mL/min (A) (by C-G formula based on SCr of 4.52 mg/dL (H)).  Liver  Function Tests: Recent Labs  Lab 12/06/21 0417 12/07/21 0256  ALBUMIN 2.6* 2.6*   No results for input(s): LIPASE, AMYLASE in the last 168 hours. No results for input(s): AMMONIA in the last 168 hours.  Coagulation Profile: No results for input(s): INR, PROTIME in the last 168 hours.  Cardiac Enzymes: No results for input(s): CKTOTAL, CKMB, CKMBINDEX, TROPONINI in the last 168 hours.  BNP (last 3 results) No results for input(s): PROBNP in the last 8760 hours.  Lipid Profile: No results for input(s): CHOL, HDL, LDLCALC, TRIG, CHOLHDL, LDLDIRECT in the last 72 hours.  Thyroid Function Tests: No results for input(s): TSH, T4TOTAL, FREET4, T3FREE, THYROIDAB in the last 72 hours.  Anemia Panel: No results for input(s): VITAMINB12, FOLATE, FERRITIN, TIBC, IRON, RETICCTPCT in the last 72 hours.  Urine analysis: No results found for:  COLORURINE, APPEARANCEUR, LABSPEC, PHURINE, GLUCOSEU, HGBUR, BILIRUBINUR, KETONESUR, PROTEINUR, UROBILINOGEN, NITRITE, LEUKOCYTESUR  Sepsis Labs: Lactic Acid, Venous No results found for: LATICACIDVEN  MICROBIOLOGY: Recent Results (from the past 240 hour(s))  Resp Panel by RT-PCR (Flu A&B, Covid) Nasopharyngeal Swab     Status: None   Collection Time: 12/05/21 11:06 PM   Specimen: Nasopharyngeal Swab; Nasopharyngeal(NP) swabs in vial transport medium  Result Value Ref Range Status   SARS Coronavirus 2 by RT PCR NEGATIVE NEGATIVE Final    Comment: (NOTE) SARS-CoV-2 target nucleic acids are NOT DETECTED.  The SARS-CoV-2 RNA is generally detectable in upper respiratory specimens during the acute phase of infection. The lowest concentration of SARS-CoV-2 viral copies this assay can detect is 138 copies/mL. A negative result does not preclude SARS-Cov-2 infection and should not be used as the sole basis for treatment or other patient management decisions. A negative result may occur with  improper specimen collection/handling, submission of  specimen other than nasopharyngeal swab, presence of viral mutation(s) within the areas targeted by this assay, and inadequate number of viral copies(<138 copies/mL). A negative result must be combined with clinical observations, patient history, and epidemiological information. The expected result is Negative.  Fact Sheet for Patients:  EntrepreneurPulse.com.au  Fact Sheet for Healthcare Providers:  IncredibleEmployment.be  This test is no t yet approved or cleared by the [place] FDA and  has been authorized for detection and/or diagnosis of SARS-CoV-2 by FDA under an Emergency Use Authorization (EUA). This EUA will remain  in effect (meaning this test can be used) for the duration of the COVID-19 declaration under Section 564(b)(1) of the Act, 21 U.S.C.section 360bbb-3(b)(1), unless the authorization is terminated  or revoked sooner.       Influenza A by PCR NEGATIVE NEGATIVE Final   Influenza B by PCR NEGATIVE NEGATIVE Final    Comment: (NOTE) The Xpert Xpress SARS-CoV-2/FLU/RSV plus assay is intended as an aid in the diagnosis of influenza from Nasopharyngeal swab specimens and should not be used as a sole basis for treatment. Nasal washings and aspirates are unacceptable for Xpert Xpress SARS-CoV-2/FLU/RSV testing.  Fact Sheet for Patients: EntrepreneurPulse.com.au  Fact Sheet for Healthcare Providers: IncredibleEmployment.be  This test is not yet approved or cleared by the [place] FDA and has been authorized for detection and/or diagnosis of SARS-CoV-2 by FDA under an Emergency Use Authorization (EUA). This EUA will remain in effect (meaning this test can be used) for the duration of the COVID-19 declaration under Section 564(b)(1) of the Act, 21 U.S.C. section 360bbb-3(b)(1), unless the authorization is terminated or revoked.  Performed at [facility], [address],  [place], [place] [address]     RADIOLOGY STUDIES/RESULTS: MR ANGIO HEAD WO CONTRAST  Result Date: 12/07/2021 CLINICAL DATA:  Neuro deficit, acute, stroke suspected EXAM: MRA NECK WITHOUT CONTRAST MRA HEAD WITHOUT CONTRAST TECHNIQUE: Angiographic images of the Circle of Willis were acquired using MRA technique without intravenous contrast. COMPARISON:  None. FINDINGS: MRA NECK FINDINGS Motion artifact is present. Common, internal, and external carotid arteries are patent. Codominant extracranial vertebral arteries are patent. No apparent hemodynamically significant stenosis. MRA HEAD FINDINGS Motion artifact is present. Internal carotid arteries are patent. Proximal anterior and middle cerebral arteries are patent. Intracranial vertebral arteries and basilar artery are patent. Appears to be a right posterior communicating artery. Proximal right posterior cerebral artery is patent. Proximal left posterior cerebral artery is not well evaluated the on the P1 segment. IMPRESSION: Significant motion degradation. No large vessel occlusion  or hemodynamically significant stenosis identified in the neck. No proximal intracranial vessel occlusion, noting the proximal left posterior cerebral artery is not well evaluated. Electronically Signed   By: Macy Mis M.D.   On: 12/07/2021 15:25   MR ANGIO NECK WO CONTRAST  Result Date: 12/07/2021 CLINICAL DATA:  Neuro deficit, acute, stroke suspected EXAM: MRA NECK WITHOUT CONTRAST MRA HEAD WITHOUT CONTRAST TECHNIQUE: Angiographic images of the Circle of Willis were acquired using MRA technique without intravenous contrast. COMPARISON:  None. FINDINGS: MRA NECK FINDINGS Motion artifact is present. Common, internal, and external carotid arteries are patent. Codominant extracranial vertebral arteries are patent. No apparent hemodynamically significant stenosis. MRA HEAD FINDINGS Motion artifact is present. Internal carotid arteries are patent. Proximal anterior and middle  cerebral arteries are patent. Intracranial vertebral arteries and basilar artery are patent. Appears to be a right posterior communicating artery. Proximal right posterior cerebral artery is patent. Proximal left posterior cerebral artery is not well evaluated the on the P1 segment. IMPRESSION: Significant motion degradation. No large vessel occlusion or hemodynamically significant stenosis identified in the neck. No proximal intracranial vessel occlusion, noting the proximal left posterior cerebral artery is not well evaluated. Electronically Signed   By: Macy Mis M.D.   On: 12/07/2021 15:25   MR BRAIN WO CONTRAST  Result Date: 12/07/2021 CLINICAL DATA:  Provided history: Neuro deficit, acute, stroke suspected. EXAM: MRI HEAD WITHOUT CONTRAST TECHNIQUE: Multiplanar, multiecho pulse sequences of the brain and surrounding structures were obtained without intravenous contrast. COMPARISON:  MRA head and MRA neck performed earlier today 12/07/2021. FINDINGS: Brain: Cerebral volume appears normal for age. Small focus of cortical/subcortical T2 FLAIR hyperintense signal abnormality within the mid to posterior right temporal lobe, which may reflect posttraumatic encephalomalacia or a chronic infarct (for instance as seen on series 5, image 10). Additional small foci of cortical/subcortical T2 FLAIR hyperintense signal abnormality within the right occipital lobe and right parietal lobe (for instance as seen on series 5, images 11, 18 and 19). These likely reflect chronic infarcts. Chronic small-vessel infarcts within the bilateral cerebral hemispheric white matter, left basal ganglia, left thalamus and posterior limb of left internal capsule. Background moderate (but significantly age-advanced) multifocal T2 FLAIR hyperintense signal abnormality within the cerebral white matter, nonspecific but likely reflecting chronic small vessel ischemic disease given the patient's history of ESRD, hyperlipidemia, hypertension  and type 2 diabetes mellitus. Extensive T2 FLAIR hyperintense signal abnormality within the pons. There are fairly numerous supratentorial chronic microhemorrhages, most notably within the right temporal lobe. There is no acute infarct. No evidence of an intracranial mass. No extra-axial fluid collection. No midline shift. Vascular: Maintained flow voids within the proximal large arterial vessels. Skull and upper cervical spine: No focal suspicious marrow lesion. Sinuses/Orbits: Visualized orbits show no acute finding. Mild mucosal thickening within the bilateral ethmoid, sphenoid and maxillary sinuses. Superimposed small mucous retention cyst within the right maxillary sinus. Other: Right mastoid effusion. Trace fluid also present within the left mastoid air cells. Impression #6 will be called to the ordering clinician or representative by the Radiologist Assistant, and communication documented in the PACS or [employer]. IMPRESSION: 1. No evidence of acute infarction. 2. Small foci of cortical/subcortical T2 FLAIR hyperintense signal abnormality within the right occipital and parietal lobes, likely reflect chronic infarcts. 3. Small focus of cortical/subcortical T2 FLAIR hyperintense signal abnormality within the mid to posterior right temporal lobe. This may reflect posttraumatic encephalomalacia or a chronic infarct. 4. Chronic small-vessel infarcts within the bilateral cerebral hemispheric white  matter, left basal ganglia, left thalamus and posterior limb of left internal capsule. 5. Background moderate (but significantly age-advanced) signal changes within the cerebral white matter, nonspecific but likely reflecting chronic small vessel ischemic disease given the patient's history of ESRD, hyperlipidemia, hypertension and type 2 diabetes. 6. Prominent T2 FLAIR hyperintense signal changes within the pons, which may also reflect chronic small vessel ischemic disease. However, a component of posterior  reversal encephalopathy syndrome (PRES) cannot be excluded (particularly given the foci of cortical/subcortical T2 FLAIR hyperintense signal abnormality described above). 7. Fairly numerous supratentorial chronic microhemorrhages, which may reflect sequela of chronic hypertensive microangiopathy and/or cerebral amyloid angiopathy. 8. Paranasal sinus disease, as described. 9. Right mastoid effusion. Trace fluid also present within the left mastoid air cells. Electronically Signed   By: Kellie Simmering D.O.   On: 12/07/2021 20:00     LOS: 2 days   Oren Binet, MD  [facility]    To contact the attending provider between 7A-7P or the covering provider during after hours 7P-7A, please log into the web site [email or web address] and access using universal [place] password for that web site. If you do not have the password, please call the hospital operator.  12/08/2021, 11:44 AM
